# Patient Record
Sex: Female | Born: 1950 | Race: White | Hispanic: No | Marital: Single | Smoking: Never smoker
Health system: Southern US, Community
[De-identification: ages and names within clinical notes are randomized; demographics above are authoritative.]

## PROBLEM LIST (undated history)

## (undated) DIAGNOSIS — N189 Chronic kidney disease, unspecified: Secondary | ICD-10-CM

## (undated) DIAGNOSIS — E7800 Pure hypercholesterolemia, unspecified: Secondary | ICD-10-CM

## (undated) DIAGNOSIS — I10 Essential (primary) hypertension: Secondary | ICD-10-CM

## (undated) DIAGNOSIS — R87619 Unspecified abnormal cytological findings in specimens from cervix uteri: Secondary | ICD-10-CM

## (undated) DIAGNOSIS — H269 Unspecified cataract: Secondary | ICD-10-CM

## (undated) DIAGNOSIS — T7840XA Allergy, unspecified, initial encounter: Secondary | ICD-10-CM

## (undated) HISTORY — PX: GYNECOLOGIC CRYOSURGERY: SHX857

## (undated) HISTORY — PX: DILATION AND CURETTAGE OF UTERUS: SHX78

## (undated) HISTORY — PX: HERNIA REPAIR: SHX51

## (undated) HISTORY — DX: Chronic kidney disease, unspecified: N18.9

## (undated) HISTORY — DX: Allergy, unspecified, initial encounter: T78.40XA

## (undated) HISTORY — DX: Unspecified abnormal cytological findings in specimens from cervix uteri: R87.619

## (undated) HISTORY — DX: Unspecified cataract: H26.9

## (undated) HISTORY — DX: Pure hypercholesterolemia, unspecified: E78.00

---

## 1979-02-28 HISTORY — PX: GYNECOLOGIC CRYOSURGERY: SHX857

## 2001-02-27 HISTORY — PX: DILATION AND CURETTAGE OF UTERUS: SHX78

## 2011-06-21 ENCOUNTER — Other Ambulatory Visit (HOSPITAL_BASED_OUTPATIENT_CLINIC_OR_DEPARTMENT_OTHER): Payer: Self-pay | Admitting: Family Medicine

## 2011-06-21 ENCOUNTER — Ambulatory Visit (HOSPITAL_BASED_OUTPATIENT_CLINIC_OR_DEPARTMENT_OTHER)
Admission: RE | Admit: 2011-06-21 | Discharge: 2011-06-21 | Disposition: A | Discharge status: Home / Self Care | Payer: BC Managed Care – PPO | Source: Ambulatory Visit | Attending: Family Medicine | Admitting: Family Medicine

## 2011-06-21 DIAGNOSIS — R1013 Epigastric pain: Secondary | ICD-10-CM | POA: Insufficient documentation

## 2011-06-21 DIAGNOSIS — K7689 Other specified diseases of liver: Secondary | ICD-10-CM | POA: Insufficient documentation

## 2015-03-31 DIAGNOSIS — H2513 Age-related nuclear cataract, bilateral: Secondary | ICD-10-CM | POA: Insufficient documentation

## 2015-03-31 DIAGNOSIS — E782 Mixed hyperlipidemia: Secondary | ICD-10-CM | POA: Insufficient documentation

## 2015-03-31 DIAGNOSIS — I10 Essential (primary) hypertension: Secondary | ICD-10-CM | POA: Diagnosis present

## 2015-03-31 DIAGNOSIS — R748 Abnormal levels of other serum enzymes: Secondary | ICD-10-CM | POA: Diagnosis present

## 2015-03-31 DIAGNOSIS — K760 Fatty (change of) liver, not elsewhere classified: Secondary | ICD-10-CM | POA: Diagnosis present

## 2017-03-20 DIAGNOSIS — M810 Age-related osteoporosis without current pathological fracture: Secondary | ICD-10-CM | POA: Insufficient documentation

## 2018-04-29 DIAGNOSIS — R311 Benign essential microscopic hematuria: Secondary | ICD-10-CM | POA: Insufficient documentation

## 2020-05-03 ENCOUNTER — Emergency Department (HOSPITAL_BASED_OUTPATIENT_CLINIC_OR_DEPARTMENT_OTHER): Payer: Medicare PPO

## 2020-05-03 ENCOUNTER — Encounter (HOSPITAL_BASED_OUTPATIENT_CLINIC_OR_DEPARTMENT_OTHER): Payer: Self-pay | Admitting: *Deleted

## 2020-05-03 ENCOUNTER — Inpatient Hospital Stay (HOSPITAL_COMMUNITY): Payer: Medicare PPO

## 2020-05-03 ENCOUNTER — Inpatient Hospital Stay (HOSPITAL_BASED_OUTPATIENT_CLINIC_OR_DEPARTMENT_OTHER)
Admission: EM | Admit: 2020-05-03 | Discharge: 2020-05-09 | DRG: 328 | Disposition: A | Discharge status: Home / Self Care | Payer: Medicare PPO | Attending: Surgery | Admitting: Surgery

## 2020-05-03 ENCOUNTER — Other Ambulatory Visit: Payer: Self-pay

## 2020-05-03 DIAGNOSIS — I701 Atherosclerosis of renal artery: Secondary | ICD-10-CM | POA: Diagnosis present

## 2020-05-03 DIAGNOSIS — Z888 Allergy status to other drugs, medicaments and biological substances status: Secondary | ICD-10-CM | POA: Diagnosis not present

## 2020-05-03 DIAGNOSIS — K760 Fatty (change of) liver, not elsewhere classified: Secondary | ICD-10-CM | POA: Diagnosis present

## 2020-05-03 DIAGNOSIS — I10 Essential (primary) hypertension: Secondary | ICD-10-CM | POA: Diagnosis present

## 2020-05-03 DIAGNOSIS — K3189 Other diseases of stomach and duodenum: Secondary | ICD-10-CM | POA: Diagnosis present

## 2020-05-03 DIAGNOSIS — R1012 Left upper quadrant pain: Secondary | ICD-10-CM | POA: Diagnosis not present

## 2020-05-03 DIAGNOSIS — R112 Nausea with vomiting, unspecified: Secondary | ICD-10-CM | POA: Diagnosis not present

## 2020-05-03 DIAGNOSIS — R1110 Vomiting, unspecified: Secondary | ICD-10-CM

## 2020-05-03 DIAGNOSIS — Z9889 Other specified postprocedural states: Secondary | ICD-10-CM

## 2020-05-03 DIAGNOSIS — Z882 Allergy status to sulfonamides status: Secondary | ICD-10-CM

## 2020-05-03 DIAGNOSIS — K224 Dyskinesia of esophagus: Secondary | ICD-10-CM | POA: Diagnosis present

## 2020-05-03 DIAGNOSIS — K449 Diaphragmatic hernia without obstruction or gangrene: Secondary | ICD-10-CM | POA: Diagnosis present

## 2020-05-03 DIAGNOSIS — Z20822 Contact with and (suspected) exposure to covid-19: Secondary | ICD-10-CM | POA: Diagnosis present

## 2020-05-03 DIAGNOSIS — Z0189 Encounter for other specified special examinations: Secondary | ICD-10-CM

## 2020-05-03 DIAGNOSIS — Z79899 Other long term (current) drug therapy: Secondary | ICD-10-CM | POA: Diagnosis not present

## 2020-05-03 DIAGNOSIS — K2100 Gastro-esophageal reflux disease with esophagitis, without bleeding: Secondary | ICD-10-CM | POA: Diagnosis present

## 2020-05-03 DIAGNOSIS — R748 Abnormal levels of other serum enzymes: Secondary | ICD-10-CM | POA: Diagnosis present

## 2020-05-03 HISTORY — DX: Essential (primary) hypertension: I10

## 2020-05-03 LAB — CBC
HCT: 44.5 % (ref 36.0–46.0)
Hemoglobin: 15.4 g/dL — ABNORMAL HIGH (ref 12.0–15.0)
MCH: 29.8 pg (ref 26.0–34.0)
MCHC: 34.6 g/dL (ref 30.0–36.0)
MCV: 86.1 fL (ref 80.0–100.0)
Platelets: 257 10*3/uL (ref 150–400)
RBC: 5.17 MIL/uL — ABNORMAL HIGH (ref 3.87–5.11)
RDW: 12.4 % (ref 11.5–15.5)
WBC: 6.1 10*3/uL (ref 4.0–10.5)
nRBC: 0 % (ref 0.0–0.2)

## 2020-05-03 LAB — TROPONIN I (HIGH SENSITIVITY)
Troponin I (High Sensitivity): 5 ng/L (ref <18)
Troponin I (High Sensitivity): 5 ng/L (ref <18)

## 2020-05-03 LAB — HEPATIC FUNCTION PANEL
ALT: 56 U/L — ABNORMAL HIGH (ref 0–44)
AST: 55 U/L — ABNORMAL HIGH (ref 15–41)
Albumin: 4.7 g/dL (ref 3.5–5.0)
Alkaline Phosphatase: 70 U/L (ref 38–126)
Bilirubin, Direct: 0.1 mg/dL (ref 0.0–0.2)
Indirect Bilirubin: 1.1 mg/dL — ABNORMAL HIGH (ref 0.3–0.9)
Total Bilirubin: 1.2 mg/dL (ref 0.3–1.2)
Total Protein: 8.5 g/dL — ABNORMAL HIGH (ref 6.5–8.1)

## 2020-05-03 LAB — BASIC METABOLIC PANEL
Anion gap: 14 (ref 5–15)
BUN: 20 mg/dL (ref 8–23)
CO2: 25 mmol/L (ref 22–32)
Calcium: 10.3 mg/dL (ref 8.9–10.3)
Chloride: 98 mmol/L (ref 98–111)
Creatinine, Ser: 1.05 mg/dL — ABNORMAL HIGH (ref 0.44–1.00)
GFR, Estimated: 58 mL/min — ABNORMAL LOW (ref >60)
Glucose, Bld: 112 mg/dL — ABNORMAL HIGH (ref 70–99)
Potassium: 2.9 mmol/L — ABNORMAL LOW (ref 3.5–5.1)
Sodium: 137 mmol/L (ref 135–145)

## 2020-05-03 LAB — RESP PANEL BY RT-PCR (FLU A&B, COVID) ARPGX2
Influenza A by PCR: NEGATIVE
Influenza B by PCR: NEGATIVE
SARS Coronavirus 2 by RT PCR: NEGATIVE

## 2020-05-03 LAB — LIPASE, BLOOD: Lipase: 32 U/L (ref 11–51)

## 2020-05-03 MED ORDER — SODIUM CHLORIDE 0.9 % IV BOLUS
500.0000 mL | Freq: Once | INTRAVENOUS | Status: AC
  Administered 2020-05-03: 500 mL via INTRAVENOUS

## 2020-05-03 MED ORDER — ONDANSETRON HCL 4 MG/2ML IJ SOLN: 4.0000 mg | Freq: Four times a day (QID) | INTRAMUSCULAR | Status: DC | PRN

## 2020-05-03 MED ORDER — FENTANYL CITRATE (PF) 100 MCG/2ML IJ SOLN
50.0000 ug | Freq: Once | INTRAMUSCULAR | Status: AC
  Administered 2020-05-03: 50 ug via INTRAVENOUS
  Filled 2020-05-03: qty 2

## 2020-05-03 MED ORDER — IOHEXOL 350 MG/ML SOLN
100.0000 mL | Freq: Once | INTRAVENOUS | Status: AC | PRN
  Administered 2020-05-03: 100 mL via INTRAVENOUS

## 2020-05-03 MED ORDER — ONDANSETRON 4 MG PO TBDP: 4.0000 mg | ORAL_TABLET | Freq: Four times a day (QID) | ORAL | Status: DC | PRN

## 2020-05-03 MED ORDER — HYDROMORPHONE HCL 1 MG/ML IJ SOLN
0.5000 mg | INTRAMUSCULAR | Status: DC | PRN
  Administered 2020-05-03 – 2020-05-06 (×5): 0.5 mg via INTRAVENOUS
  Filled 2020-05-03 (×5): qty 0.5

## 2020-05-03 MED ORDER — ENOXAPARIN SODIUM 40 MG/0.4ML ~~LOC~~ SOLN
40.0000 mg | SUBCUTANEOUS | Status: DC
  Administered 2020-05-03 – 2020-05-05 (×3): 40 mg via SUBCUTANEOUS
  Filled 2020-05-03 (×3): qty 0.4

## 2020-05-03 MED ORDER — METOPROLOL TARTRATE 5 MG/5ML IV SOLN
5.0000 mg | Freq: Four times a day (QID) | INTRAVENOUS | Status: DC | PRN
  Administered 2020-05-06: 5 mg via INTRAVENOUS
  Filled 2020-05-03 (×3): qty 5

## 2020-05-03 MED ORDER — ONDANSETRON HCL 4 MG/2ML IJ SOLN
4.0000 mg | Freq: Once | INTRAMUSCULAR | Status: AC
  Administered 2020-05-03: 4 mg via INTRAVENOUS
  Filled 2020-05-03: qty 2

## 2020-05-03 MED ORDER — KCL IN DEXTROSE-NACL 20-5-0.45 MEQ/L-%-% IV SOLN
INTRAVENOUS | Status: DC
  Filled 2020-05-03 (×8): qty 1000

## 2020-05-03 NOTE — ED Triage Notes (Signed)
Left upper quad abdominal pain into her epigastric region x 3 hours with sudden onset after lunch. No relief with pepto bismol.

## 2020-05-03 NOTE — ED Provider Notes (Signed)
[facility] EMERGENCY DEPARTMENT Provider Note   CSN: [id number] Arrival date & time: 05/03/20  1556     History Chief Complaint  Patient presents with  . Abdominal Pain    Monica Lara is a 70 y.o. female.  The history is provided by the patient and medical records.  Abdominal Pain  Monica Lara is a 70 y.o. female who presents to the Emergency Department complaining of abdominal pain. She presents the emergency department complaining of severe and sudden onset left upper quadrant abdominal pain that started about three hours ago. Initially pain was located in her central/lower chest and now radiates to her left upper quadrant. Pain is constant and severe. She has associated nausea. No vomiting, fevers. She initially had difficulty breathing, this is now improving. No prior similar symptoms. She has a history of hypertension, no additional medical problems. No prior abdominal surgeries.    Past Medical History:  Diagnosis Date  . Hypertension     Patient Active Problem List   Diagnosis Date Noted  . Acute gastric volvulus 05/03/2020    History reviewed. No pertinent surgical history.   OB History   No obstetric history on file.     No family history on file.  Social History   Tobacco Use  . Smoking status: Never Smoker  . Smokeless tobacco: Never Used  Substance Use Topics  . Alcohol use: Never  . Drug use: Never    Home Medications Prior to Admission medications   Medication Sig Start Date End Date Taking? Authorizing Provider  telmisartan-hydrochlorothiazide (MICARDIS HCT) 40-12.5 MG tablet Take 1 tablet by mouth daily. 08/21/19  Yes [provider]    Allergies    3-methyl-2-benzothiazolinone hydrazone, Ezetimibe, Sulfa antibiotics, and Statins  Review of Systems   Review of Systems  Gastrointestinal: Positive for abdominal pain.  All other systems reviewed and are negative.   Physical Exam Updated Vital Signs BP (!) 162/90 (BP  Location: Right Arm)   Pulse 98   Temp 98.7 F (37.1 C) (Oral)   Resp 20   Ht 5' (1.524 m)   Wt 63.5 kg   SpO2 95%   BMI 27.34 kg/m   Physical Exam Vitals and nursing note reviewed.  Constitutional:      General: She is in acute distress.     Appearance: She is well-developed and well-nourished.     Comments: Appears uncomfortable, pacing in the room.  HENT:     Head: Normocephalic and atraumatic.  Cardiovascular:     Rate and Rhythm: Normal rate and regular rhythm.     Heart sounds: No murmur heard.   Pulmonary:     Effort: Pulmonary effort is normal. No respiratory distress.     Breath sounds: Normal breath sounds.  Abdominal:     Palpations: Abdomen is soft.     Tenderness: There is no abdominal tenderness. There is no guarding or rebound.  Musculoskeletal:        General: No tenderness or edema.     Comments: 2+ DP pulses bilaterally  Skin:    General: Skin is warm and dry.  Neurological:     Mental Status: She is alert and oriented to person, place, and time.  Psychiatric:        Mood and Affect: Mood and affect normal.        Behavior: Behavior normal.     ED Results / Procedures / Treatments   Labs (all labs ordered are listed, but only abnormal results are displayed)  Labs Reviewed  BASIC METABOLIC PANEL - Abnormal; Notable for the following components:      Result Value   Potassium 2.9 (*)    Glucose, Bld 112 (*)    Creatinine, Ser 1.05 (*)    GFR, Estimated 58 (*)    All other components within normal limits  CBC - Abnormal; Notable for the following components:   RBC 5.17 (*)    Hemoglobin 15.4 (*)    All other components within normal limits  HEPATIC FUNCTION PANEL - Abnormal; Notable for the following components:   Total Protein 8.5 (*)    AST 55 (*)    ALT 56 (*)    Indirect Bilirubin 1.1 (*)    All other components within normal limits  RESP PANEL BY RT-PCR (FLU A&B, COVID) ARPGX2  LIPASE, BLOOD  HIV ANTIBODY (ROUTINE TESTING W REFLEX)   CBC  BASIC METABOLIC PANEL  TROPONIN I (HIGH SENSITIVITY)  TROPONIN I (HIGH SENSITIVITY)    EKG EKG Interpretation  Date/Time:  Monday May 03 2020 16:08:57 EST Ventricular Rate:  94 PR Interval:  182 QRS Duration: 70 QT Interval:  358 QTC Calculation: 447 R Axis:   44 Text Interpretation: Normal sinus rhythm Cannot rule out Anterior infarct , age undetermined Abnormal ECG No previous tracing Confirmed by Tilden Fossa [phone]) on 05/03/2020 4:11:52 PM   Radiology DG Chest 2 View  Result Date: 05/03/2020 CLINICAL DATA:  Epigastric pain. EXAM: CHEST - 2 VIEW COMPARISON:  None. FINDINGS: The cardiac silhouette is normal in size. There is a moderate-sized hiatal hernia. There is mild elevation of the right hemidiaphragm. No airspace consolidation, edema, pleural effusion, or pneumothorax is identified. No acute osseous abnormality is seen. IMPRESSION: Hiatal hernia.  No evidence of acute cardiopulmonary disease. Electronically Signed   By: Sebastian Ache M.D.   On: 05/03/2020 16:54    Procedures Procedures  CRITICAL CARE Performed by: Tilden Fossa   Total critical care time: 35 minutes  Critical care time was exclusive of separately billable procedures and treating other patients.  Critical care was necessary to treat or prevent imminent or life-threatening deterioration.  Critical care was time spent personally by me on the following activities: development of treatment plan with patient and/or surrogate as well as nursing, discussions with consultants, evaluation of patient's response to treatment, examination of patient, obtaining history from patient or surrogate, ordering and performing treatments and interventions, ordering and review of laboratory studies, ordering and review of radiographic studies, pulse oximetry and re-evaluation of patient's condition.  Medications Ordered in ED Medications  enoxaparin (LOVENOX) injection 40 mg (40 mg Subcutaneous Given 05/03/20 2221)   dextrose 5 % and 0.45 % NaCl with KCl 20 mEq/L infusion ( Intravenous New Bag/Given 05/03/20 2221)  HYDROmorphone (DILAUDID) injection 0.5 mg (0.5 mg Intravenous Given 05/03/20 2221)  ondansetron (ZOFRAN-ODT) disintegrating tablet 4 mg (has no administration in time range)    Or  ondansetron (ZOFRAN) injection 4 mg (has no administration in time range)  metoprolol tartrate (LOPRESSOR) injection 5 mg (has no administration in time range)  fentaNYL (SUBLIMAZE) injection 50 mcg (50 mcg Intravenous Given 05/03/20 1707)  ondansetron (ZOFRAN) injection 4 mg (4 mg Intravenous Given 05/03/20 1706)  sodium chloride 0.9 % bolus 500 mL (0 mLs Intravenous Stopped 05/03/20 1942)  iohexol (OMNIPAQUE) 350 MG/ML injection 100 mL (100 mLs Intravenous Contrast Given 05/03/20 1748)  ondansetron (ZOFRAN) injection 4 mg (4 mg Intravenous Given 05/03/20 1830)    ED Course  I have reviewed the triage vital  signs and the nursing notes.  Pertinent labs & imaging results that were available during my care of the patient were reviewed by me and considered in my medical decision making (see chart for details).    MDM Rules/Calculators/A&P                          Patient here for evaluation of acute onset left upper quadrant abdominal pain. Patient uncomfortable appearing on ED presentation. Imaging is concerning for gastric volvulus. Discussed with Dr. Maisie Fus with general surgery. Will place NG tube and transfer to [facility] for further evaluation. Patient is in agreement with treatment plan. Final Clinical Impression(s) / ED Diagnoses Final diagnoses:  Acute gastric volvulus    Rx / DC Orders ED Discharge Orders    None       Tilden Fossa, MD 05/03/20 2326

## 2020-05-03 NOTE — ED Notes (Signed)
After placing the NG tube X ray was called for portable x ray to check placement.  Dr Madilyn Hook was called to look at x ray to look at the screen before hooked to low wall intermittent suction.

## 2020-05-03 NOTE — H&P (Signed)
CC: abd pain  Requesting provider: Dr Madilyn Hook  HPI: Monica Lara is an 70 y.o. female [facility] complaining of severe sudden onset LUQ abdominal pain that started about three hours ago. Initially pain was located in her central/lower chest and now radiates to her left upper quadrant. Pain is constant and severe. She has associated nausea. No vomiting, fevers. She initially had difficulty breathing, this is now improved. No prior similar symptoms. She has a history of hypertension, no additional medical problems. No prior abdominal surgeries.  Past Medical History:  Diagnosis Date  . Hypertension     History reviewed. No pertinent surgical history.  No family history on file.  Social:  reports that she has never smoked. She has never used smokeless tobacco. She reports that she does not drink alcohol and does not use drugs.  Allergies: No Known Allergies  Medications: I have reviewed the patient's current medications.  Results for orders placed or performed during the hospital encounter of 05/03/20 (from the past 48 hour(s))  Basic metabolic panel     Status: Abnormal   Collection Time: 05/03/20  4:20 PM  Result Value Ref Range   Sodium 137 135 - 145 mmol/L   Potassium 2.9 (L) 3.5 - 5.1 mmol/L   Chloride 98 98 - 111 mmol/L   CO2 25 22 - 32 mmol/L   Glucose, Bld 112 (H) 70 - 99 mg/dL    Comment: Glucose reference range applies only to samples taken after fasting for at least 8 hours.   BUN 20 8 - 23 mg/dL   Creatinine, Ser 3.97 (H) 0.44 - 1.00 mg/dL   Calcium 67.3 8.9 - 41.9 mg/dL   GFR, Estimated 58 (L) >60 mL/min    Comment: (NOTE) Calculated using the CKD-EPI Creatinine Equation (2021)    Anion gap 14 5 - 15    Comment: Performed at [facility], [address] [place], [place] [address]  CBC     Status: Abnormal   Collection Time: 05/03/20  4:20 PM  Result Value Ref Range   WBC 6.1 4.0 - 10.5 K/uL   RBC 5.17 (H) 3.87 -  5.11 MIL/uL   Hemoglobin 15.4 (H) 12.0 - 15.0 g/dL   HCT 40.9 73.5 - 32.9 %   MCV 86.1 80.0 - 100.0 fL   MCH 29.8 26.0 - 34.0 pg   MCHC 34.6 30.0 - 36.0 g/dL   RDW 92.4 26.8 - 34.1 %   Platelets 257 150 - 400 K/uL   nRBC 0.0 0.0 - 0.2 %    Comment: Performed at [facility], [address] [place], [place] [address]  Troponin I (High Sensitivity)     Status: None   Collection Time: 05/03/20  4:20 PM  Result Value Ref Range   Troponin I (High Sensitivity) 5 <18 ng/L    Comment: (NOTE) Elevated high sensitivity troponin I (hsTnI) values and significant  changes across serial measurements may suggest ACS but many other  chronic and acute conditions are known to elevate hsTnI results.  Refer to the "Links" section for chest pain algorithms and additional  guidance. Performed at [facility], [address] [place], [place] [address]   Hepatic function panel     Status: Abnormal   Collection Time: 05/03/20  5:04 PM  Result Value Ref Range   Total Protein 8.5 (H) 6.5 - 8.1 g/dL   Albumin 4.7 3.5 - 5.0 g/dL  AST 55 (H) 15 - 41 U/L   ALT 56 (H) 0 - 44 U/L   Alkaline Phosphatase 70 38 - 126 U/L   Total Bilirubin 1.2 0.3 - 1.2 mg/dL   Bilirubin, Direct 0.1 0.0 - 0.2 mg/dL   Indirect Bilirubin 1.1 (H) 0.3 - 0.9 mg/dL    Comment: Performed at [facility], [address] [place], [place] [address]  Lipase, blood     Status: None   Collection Time: 05/03/20  5:04 PM  Result Value Ref Range   Lipase 32 11 - 51 U/L    Comment: Performed at [facility], [address] [place], [place] [address]  Troponin I (High Sensitivity)     Status: None   Collection Time: 05/03/20  6:28 PM  Result Value Ref Range   Troponin I (High Sensitivity) 5 <18 ng/L    Comment: (NOTE) Elevated high sensitivity troponin I (hsTnI) values and significant  changes across serial measurements may suggest ACS but many other  chronic and acute conditions are known to  elevate hsTnI results.  Refer to the "Links" section for chest pain algorithms and additional  guidance. Performed at [facility], [address], [place], [place] [address]     DG Chest 2 View  Result Date: 05/03/2020 CLINICAL DATA:  Epigastric pain. EXAM: CHEST - 2 VIEW COMPARISON:  None. FINDINGS: The cardiac silhouette is normal in size. There is a moderate-sized hiatal hernia. There is mild elevation of the right hemidiaphragm. No airspace consolidation, edema, pleural effusion, or pneumothorax is identified. No acute osseous abnormality is seen. IMPRESSION: Hiatal hernia.  No evidence of acute cardiopulmonary disease. Electronically Signed   By: Sebastian Ache M.D.   On: 05/03/2020 16:54    ROS - all of the below systems have been reviewed with the patient and positives are indicated with bold text General: chills, fever or night sweats Eyes: blurry vision or double vision ENT: epistaxis or sore throat Allergy/Immunology: itchy/watery eyes or nasal congestion Hematologic/Lymphatic: bleeding problems, blood clots or swollen lymph nodes Endocrine: temperature intolerance or unexpected weight changes Breast: new or changing breast lumps or nipple discharge Resp: cough, shortness of breath, or wheezing CV: chest pain or dyspnea on exertion GI: as per HPI GU: dysuria, trouble voiding, or hematuria MSK: joint pain or joint stiffness Neuro: TIA or stroke symptoms Derm: pruritus and skin lesion changes Psych: anxiety and depression  PE Blood pressure (!) 164/91, pulse 86, temperature 97.7 F (36.5 C), temperature source Oral, resp. rate 18, height 5' (1.524 m), weight 63.5 kg, SpO2 98 %. Constitutional: NAD; conversant; no deformities Eyes: Moist conjunctiva; no lid lag; anicteric; PERRL Neck: Trachea midline; no thyromegaly Lungs: Normal respiratory effort; no tactile fremitus CV: RRR; no palpable thrills; no pitting edema GI: Abd soft; no palpable  hepatosplenomegaly MSK: Normal range of motion of extremities; no clubbing/cyanosis Psychiatric: Appropriate affect; alert and oriented x3 Lymphatic: No palpable cervical or axillary lymphadenopathy  Results for orders placed or performed during the hospital encounter of 05/03/20 (from the past 48 hour(s))  Basic metabolic panel     Status: Abnormal   Collection Time: 05/03/20  4:20 PM  Result Value Ref Range   Sodium 137 135 - 145 mmol/L   Potassium 2.9 (L) 3.5 - 5.1 mmol/L   Chloride 98 98 - 111 mmol/L   CO2 25 22 - 32 mmol/L   Glucose, Bld 112 (H) 70 - 99 mg/dL    Comment:  Glucose reference range applies only to samples taken after fasting for at least 8 hours.   BUN 20 8 - 23 mg/dL   Creatinine, Ser 5.281.05 (H) 0.44 - 1.00 mg/dL   Calcium 41.310.3 8.9 - 24.410.3 mg/dL   GFR, Estimated 58 (L) >60 mL/min    Comment: (NOTE) Calculated using the CKD-EPI Creatinine Equation (2021)    Anion gap 14 5 - 15    Comment: Performed at [facility], [address] [place], [place] [address]  CBC     Status: Abnormal   Collection Time: 05/03/20  4:20 PM  Result Value Ref Range   WBC 6.1 4.0 - 10.5 K/uL   RBC 5.17 (H) 3.87 - 5.11 MIL/uL   Hemoglobin 15.4 (H) 12.0 - 15.0 g/dL   HCT 25.344.5 66.436.0 - 40.346.0 %   MCV 86.1 80.0 - 100.0 fL   MCH 29.8 26.0 - 34.0 pg   MCHC 34.6 30.0 - 36.0 g/dL   RDW 47.412.4 25.911.5 - 56.315.5 %   Platelets 257 150 - 400 K/uL   nRBC 0.0 0.0 - 0.2 %    Comment: Performed at [facility], [address] [place], [place] [address]  Troponin I (High Sensitivity)     Status: None   Collection Time: 05/03/20  4:20 PM  Result Value Ref Range   Troponin I (High Sensitivity) 5 <18 ng/L    Comment: (NOTE) Elevated high sensitivity troponin I (hsTnI) values and significant  changes across serial measurements may suggest ACS but many other  chronic and acute conditions are known to elevate hsTnI results.  Refer to the "Links" section for chest pain algorithms and  additional  guidance. Performed at [facility], [address] [place], [place] [address]   Hepatic function panel     Status: Abnormal   Collection Time: 05/03/20  5:04 PM  Result Value Ref Range   Total Protein 8.5 (H) 6.5 - 8.1 g/dL   Albumin 4.7 3.5 - 5.0 g/dL   AST 55 (H) 15 - 41 U/L   ALT 56 (H) 0 - 44 U/L   Alkaline Phosphatase 70 38 - 126 U/L   Total Bilirubin 1.2 0.3 - 1.2 mg/dL   Bilirubin, Direct 0.1 0.0 - 0.2 mg/dL   Indirect Bilirubin 1.1 (H) 0.3 - 0.9 mg/dL    Comment: Performed at [facility], [address] [place], [place] [address]  Lipase, blood     Status: None   Collection Time: 05/03/20  5:04 PM  Result Value Ref Range   Lipase 32 11 - 51 U/L    Comment: Performed at [facility], [address] [place], [place] [address]  Troponin I (High Sensitivity)     Status: None   Collection Time: 05/03/20  6:28 PM  Result Value Ref Range   Troponin I (High Sensitivity) 5 <18 ng/L    Comment: (NOTE) Elevated high sensitivity troponin I (hsTnI) values and significant  changes across serial measurements may suggest ACS but many other  chronic and acute conditions are known to elevate hsTnI results.  Refer to the "Links" section for chest pain algorithms and additional  guidance. Performed at [facility], [address] [place], [place] [address]     DG Chest 2 View  Result Date: 05/03/2020 CLINICAL DATA:  Epigastric pain. EXAM: CHEST - 2 VIEW COMPARISON:  None. FINDINGS: The cardiac silhouette is normal in size.  There is a moderate-sized hiatal hernia. There is mild elevation of the right hemidiaphragm. No airspace consolidation, edema, pleural effusion, or pneumothorax is identified. No acute osseous abnormality is seen. IMPRESSION: Hiatal hernia.  No evidence of acute cardiopulmonary disease. Electronically Signed   By: Sebastian Ache M.D.   On: 05/03/2020 16:54     A/P: Monica Lara is an 70 y.o. female with  gastric volvulus transferred emergently to [facility].  NG inserted but unable to pass the obstruction and NPO.  Will consult GI.  Pt will most likely need surgical correction due to obstructive symptoms.     Vanita Panda, MD  Colorectal and General Surgery [facility] Surgery

## 2020-05-03 NOTE — ED Notes (Signed)
Pt. Reports she had Mac and Cheese for lunch today

## 2020-05-04 ENCOUNTER — Encounter (HOSPITAL_COMMUNITY): Admission: EM | Disposition: A | Discharge status: Home / Self Care | Payer: Self-pay | Source: Home / Self Care

## 2020-05-04 ENCOUNTER — Inpatient Hospital Stay (HOSPITAL_COMMUNITY): Payer: Medicare PPO

## 2020-05-04 ENCOUNTER — Encounter (HOSPITAL_COMMUNITY): Payer: Self-pay

## 2020-05-04 ENCOUNTER — Inpatient Hospital Stay (HOSPITAL_COMMUNITY): Payer: Medicare PPO | Admitting: Anesthesiology

## 2020-05-04 DIAGNOSIS — K2100 Gastro-esophageal reflux disease with esophagitis, without bleeding: Secondary | ICD-10-CM

## 2020-05-04 DIAGNOSIS — R112 Nausea with vomiting, unspecified: Secondary | ICD-10-CM | POA: Diagnosis not present

## 2020-05-04 DIAGNOSIS — K449 Diaphragmatic hernia without obstruction or gangrene: Secondary | ICD-10-CM

## 2020-05-04 DIAGNOSIS — K3189 Other diseases of stomach and duodenum: Principal | ICD-10-CM

## 2020-05-04 DIAGNOSIS — R1110 Vomiting, unspecified: Secondary | ICD-10-CM

## 2020-05-04 DIAGNOSIS — R1012 Left upper quadrant pain: Secondary | ICD-10-CM | POA: Diagnosis not present

## 2020-05-04 HISTORY — PX: ESOPHAGOGASTRODUODENOSCOPY (EGD) WITH PROPOFOL: SHX5813

## 2020-05-04 LAB — BASIC METABOLIC PANEL
Anion gap: 10 (ref 5–15)
BUN: 15 mg/dL (ref 8–23)
CO2: 22 mmol/L (ref 22–32)
Calcium: 9.5 mg/dL (ref 8.9–10.3)
Chloride: 105 mmol/L (ref 98–111)
Creatinine, Ser: 1.06 mg/dL — ABNORMAL HIGH (ref 0.44–1.00)
GFR, Estimated: 57 mL/min — ABNORMAL LOW (ref >60)
Glucose, Bld: 137 mg/dL — ABNORMAL HIGH (ref 70–99)
Potassium: 3.3 mmol/L — ABNORMAL LOW (ref 3.5–5.1)
Sodium: 137 mmol/L (ref 135–145)

## 2020-05-04 LAB — CBC
HCT: 36.4 % (ref 36.0–46.0)
Hemoglobin: 12.2 g/dL (ref 12.0–15.0)
MCH: 29.8 pg (ref 26.0–34.0)
MCHC: 33.5 g/dL (ref 30.0–36.0)
MCV: 89 fL (ref 80.0–100.0)
Platelets: 221 10*3/uL (ref 150–400)
RBC: 4.09 MIL/uL (ref 3.87–5.11)
RDW: 12.7 % (ref 11.5–15.5)
WBC: 7.2 10*3/uL (ref 4.0–10.5)
nRBC: 0 % (ref 0.0–0.2)

## 2020-05-04 LAB — HIV ANTIBODY (ROUTINE TESTING W REFLEX): HIV Screen 4th Generation wRfx: NONREACTIVE

## 2020-05-04 SURGERY — ESOPHAGOGASTRODUODENOSCOPY (EGD) WITH PROPOFOL
Anesthesia: General

## 2020-05-04 SURGERY — ESOPHAGOGASTRODUODENOSCOPY (EGD) WITH PROPOFOL
Anesthesia: Monitor Anesthesia Care

## 2020-05-04 MED ORDER — FENTANYL CITRATE (PF) 100 MCG/2ML IJ SOLN
INTRAMUSCULAR | Status: DC | PRN
  Administered 2020-05-04 (×2): 50 ug via INTRAVENOUS

## 2020-05-04 MED ORDER — PROPOFOL 10 MG/ML IV BOLUS
INTRAVENOUS | Status: DC | PRN
  Administered 2020-05-04: 160 mg via INTRAVENOUS

## 2020-05-04 MED ORDER — SUCCINYLCHOLINE CHLORIDE 200 MG/10ML IV SOSY
PREFILLED_SYRINGE | INTRAVENOUS | Status: DC | PRN
  Administered 2020-05-04: 120 mg via INTRAVENOUS

## 2020-05-04 MED ORDER — ONDANSETRON HCL 4 MG/2ML IJ SOLN
INTRAMUSCULAR | Status: DC | PRN
  Administered 2020-05-04: 4 mg via INTRAVENOUS

## 2020-05-04 MED ORDER — PROPOFOL 10 MG/ML IV BOLUS
INTRAVENOUS | Status: AC
  Filled 2020-05-04: qty 20

## 2020-05-04 MED ORDER — FENTANYL CITRATE (PF) 100 MCG/2ML IJ SOLN
INTRAMUSCULAR | Status: AC
  Filled 2020-05-04: qty 2

## 2020-05-04 MED ORDER — PHENOL 1.4 % MT LIQD
1.0000 | OROMUCOSAL | Status: DC | PRN
  Administered 2020-05-04: 1 via OROMUCOSAL
  Filled 2020-05-04 (×2): qty 177

## 2020-05-04 MED ORDER — LACTATED RINGERS IV SOLN: INTRAVENOUS | Status: DC | PRN

## 2020-05-04 MED ORDER — LIDOCAINE 2% (20 MG/ML) 5 ML SYRINGE
INTRAMUSCULAR | Status: DC | PRN
  Administered 2020-05-04: 80 mg via INTRAVENOUS

## 2020-05-04 MED ORDER — LIP MEDEX EX OINT
TOPICAL_OINTMENT | CUTANEOUS | Status: AC
  Filled 2020-05-04: qty 7

## 2020-05-04 MED ORDER — SODIUM CHLORIDE 0.9 % IV SOLN: INTRAVENOUS | Status: DC

## 2020-05-04 SURGICAL SUPPLY — 14 items

## 2020-05-04 NOTE — Progress Notes (Signed)
Unable to place NG tube in stomach after 3 attempts. Contacted on call MD who stated "We will deal with it in the morning." Patient is resting with eyes closed. No c/o nausea, vomiting, nor pain voiced.

## 2020-05-04 NOTE — Anesthesia Postprocedure Evaluation (Signed)
Anesthesia Post Note  Patient: [name]  Procedure(s) Performed: ESOPHAGOGASTRODUODENOSCOPY (EGD) WITH PROPOFOL (N/A )     Patient location during evaluation: Endoscopy Anesthesia Type: General Level of consciousness: patient cooperative, oriented and sedated Pain management: pain level controlled Vital Signs Assessment: post-procedure vital signs reviewed and stable Respiratory status: spontaneous breathing, nonlabored ventilation, respiratory function stable and patient connected to nasal cannula oxygen Cardiovascular status: blood pressure returned to baseline and stable Postop Assessment: no apparent nausea or vomiting Anesthetic complications: no   No complications documented.  Last Vitals:  Vitals:   05/04/20 1219 05/04/20 1243  BP: (!) 168/96 (!) 161/87  Pulse: 85 79  Resp: (!) 9 16  Temp:  36.6 C  SpO2: 96% 98%    Last Pain:  Vitals:   05/04/20 1243  TempSrc: Oral  PainSc:                  Tyshay Adee,E. Kreig Parson

## 2020-05-04 NOTE — Consult Note (Addendum)
Referring Provider:  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  --Patient needs EGD for evaluation and possible decompression. The risks and benefits of EGD were discussed and the patient agrees to proceed. Procedure will be done today at 11:30 am . The risks and benefits of EGD were discussed and the patient agrees to proceed.   # Hx of HTN. Renal artery stenosis on CTA.   # Mildly elevated liver enzymes. Probably fatty liver on imaging.     HPI:                                                                                                                             Chief Complaint: abdominal pain  / gastric volvulus  Monica Lara is a 70 y.o. female with a history of hyperilipidemia and HTN  Patient admitted yesterday with upper abdominal pain which started a few hours prior to going to ER. The pain was epigastric with radiation into LUQ. She had associated nausea without vomiting. Patient had no prior knowledge of a hiatal hernia. She had acid reflux when one of her HTN meds were changed but other than that no GI symptoms.  CTA chest, abd, and pelvis remarkable for a large hiatal hernia and concern for gastric volvulus with at least a partial obstruction and a 3.8 cm cystic mass in right hemipelvis. Patient has no other Gi or general medical complaints.    PREVIOUS ENDOSCOPIC EVALUATIONS / PERTINENT STUDIES   05/03/20 CTA chest / abd / pelvis IMPRESSION: 1. No evidence for aortic dissection. 2. 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 This process appears to be at least partially obstructing. As such, surgical consultation is recommended. 3. Atrophic left kidney. 4. There is  a 3.8 cm cystic mass in the right hemipelvis. Is difficult to determine if this is associated with the right ovary or separate from the right ovary. Follow-up with a nonemergent outpatient pelvic ultrasound is recommended. 5. Coronary artery calcifications  Colonoscopy in [place] ~ 5 years ago per patient. Results?  Past Medical History:  Diagnosis Date  . Hypertension     History reviewed. No pertinent surgical history.  Prior to Admission medications   Medication Sig Start Date End Date Taking? Authorizing Provider  telmisartan-hydrochlorothiazide (MICARDIS HCT) 40-12.5 MG tablet Take 1 tablet by mouth daily. 08/21/19  Yes [provider]    Current Facility-Administered Medications  Medication Dose Route Frequency Provider Last Rate Last Admin  . dextrose 5 % and 0.45 % NaCl with KCl 20 mEq/L infusion   Intravenous Continuous  Romie Levee, MD 75 mL/hr at 05/03/20 2221 New Bag at 05/03/20 2221  . enoxaparin (LOVENOX) injection 40 mg  40 mg Subcutaneous Q24H Romie Levee, MD   40 mg at 05/03/20 2221  . HYDROmorphone (DILAUDID) injection 0.5 mg  0.5 mg Intravenous Q3H PRN Romie Levee, MD   0.5 mg at 05/03/20 2221  . metoprolol tartrate (LOPRESSOR) injection 5 mg  5 mg Intravenous Q6H PRN Romie Levee, MD      . ondansetron (ZOFRAN-ODT) disintegrating tablet 4 mg  4 mg Oral Q6H PRN Romie Levee, MD       Or  . ondansetron [facility]) injection 4 mg  4 mg Intravenous Q6H PRN Romie Levee, MD        Allergies as of 05/03/2020 - Review Complete 05/03/2020  Allergen Reaction Noted  . 3-methyl-2-benzothiazolinone hydrazone  03/31/2015  . Ezetimibe Other (See Comments) 05/07/2018  . Sulfa antibiotics Rash 03/31/2015  . Statins Other (See Comments) 05/03/2020    No family history on file.  Social History   Socioeconomic History  . Marital status: Single    Spouse name: Not on file  . Number of children: Not on file  . Years of education: Not on file  .  Highest education level: Not on file  Occupational History  . Not on file  Tobacco Use  . Smoking status: Never Smoker  . Smokeless tobacco: Never Used  Substance and Sexual Activity  . Alcohol use: Never  . Drug use: Never  . Sexual activity: Not on file  Other Topics Concern  . Not on file  Social History Narrative  . Not on file   Social Determinants of Health   Financial Resource Strain: Not on file  Food Insecurity: Not on file  Transportation Needs: Not on file  Physical Activity: Not on file  Stress: Not on file  Social Connections: Not on file  Intimate Partner Violence: Not on file    Review of Systems: All systems reviewed and negative except where noted in HPI.    OBJECTIVE:    Physical Exam: Vital signs in last 24 hours: Temp:  [97.7 F (36.5 C)-98.7 F (37.1 C)] 98.3 F (36.8 C) (03/08 0515) Pulse Rate:  [85-110] 98 (03/08 0515) Resp:  [16-20] 18 (03/08 0515) BP: (125-181)/(75-93) 126/86 (03/08 0515) SpO2:  [95 %-98 %] 97 % (03/08 0515) Weight:  [63.5 kg] 63.5 kg (03/07 1608) Last BM Date: 05/03/20 General:   Alert  female in NAD. NGT capped off Psych:  Pleasant, cooperative. Normal mood and affect. Eyes:  Pupils equal, sclera clear, no icterus.   Conjunctiva pink. Ears:  Normal auditory acuity. Nose:  No deformity, discharge,  or lesions. Neck:  Supple; no masses Lungs:  Clear throughout to auscultation.   No wheezes, crackles, or rhonchi.  Heart:  Regular rate and rhythm; no lower extremity edema Abdomen:  Soft, non-distended, nontender, BS active, no palp mass   Rectal:  Deferred  Msk:  Symmetrical without gross deformities. . Neurologic:  Alert and  oriented x4;  grossly normal neurologically. Skin:  Intact without significant lesions or rashes.  Filed Weights   05/03/20 1608  Weight: 63.5 kg     Scheduled inpatient medications . enoxaparin (LOVENOX) injection  40 mg Subcutaneous Q24H      Intake/Output from previous day: 03/07  0701 - 03/08 0700 In: 424.1 [IV Piggyback:424.1] Out: 0  Intake/Output this shift: No intake/output data recorded.   Lab Results: Recent Labs    05/03/20 1620 05/04/20 0520  WBC 6.1 7.2  HGB 15.4* 12.2  HCT 44.5 36.4  PLT 257 221   BMET Recent Labs    05/03/20 1620 05/04/20 0520  NA 137 137  K 2.9* 3.3*  CL 98 105  CO2 25 22  GLUCOSE 112* 137*  BUN 20 15  CREATININE 1.05* 1.06*  CALCIUM 10.3 9.5   LFT Recent Labs    05/03/20 1704  PROT 8.5*  ALBUMIN 4.7  AST 55*  ALT 56*  ALKPHOS 70  BILITOT 1.2  BILIDIR 0.1  IBILI 1.1*   PT/INR No results for input(s): LABPROT, INR in the last 72 hours. Hepatitis Panel No results for input(s): HEPBSAG, HCVAB, HEPAIGM, HEPBIGM in the last 72 hours.   . CBC Latest Ref Rng & Units 05/04/2020 05/03/2020  WBC 4.0 - 10.5 K/uL 7.2 6.1  Hemoglobin 12.0 - 15.0 g/dL 23.5 15.4(H)  Hematocrit 36.0 - 46.0 % 36.4 44.5  Platelets 150 - 400 K/uL 221 257    . CMP Latest Ref Rng & Units 05/04/2020 05/03/2020  Glucose 70 - 99 mg/dL 573(U) 202(R)  BUN 8 - 23 mg/dL 15 20  Creatinine 4.27 - 1.00 mg/dL 0.62(B) 7.62(G)  Sodium 135 - 145 mmol/L 137 137  Potassium 3.5 - 5.1 mmol/L 3.3(L) 2.9(L)  Chloride 98 - 111 mmol/L 105 98  CO2 22 - 32 mmol/L 22 25  Calcium 8.9 - 10.3 mg/dL 9.5 31.5  Total Protein 6.5 - 8.1 g/dL - 8.5(H)  Total Bilirubin 0.3 - 1.2 mg/dL - 1.2  Alkaline Phos 38 - 126 U/L - 70  AST 15 - 41 U/L - 55(H)  ALT 0 - 44 U/L - 56(H)   Studies/Results: DG Chest 2 View  Result Date: 05/03/2020 CLINICAL DATA:  Epigastric pain. EXAM: CHEST - 2 VIEW COMPARISON:  None. FINDINGS: The cardiac silhouette is normal in size. There is a moderate-sized hiatal hernia. There is mild elevation of the right hemidiaphragm. No airspace consolidation, edema, pleural effusion, or pneumothorax is identified. No acute osseous abnormality is seen. IMPRESSION: Hiatal hernia.  No evidence of acute cardiopulmonary disease. Electronically Signed   By: Sebastian Ache M.D.   On: 05/03/2020 16:54   DG Abd 1 View  Result Date: 05/04/2020 CLINICAL DATA:  NG tube placement EXAM: ABDOMEN - 1 VIEW COMPARISON:  None. FINDINGS: Further retraction of NG tube which remains within the hiatal hernia. IMPRESSION: Further retraction of nasogastric tube which remains within the hiatal hernia. Electronically Signed   By: Deatra Robinson M.D.   On: 05/04/2020 02:54   DG Abd 1 View  Result Date: 05/03/2020 CLINICAL DATA:  Nasogastric tube placement. EXAM: ABDOMEN - 1 VIEW COMPARISON:  Radiograph earlier today chest CTA earlier today. FINDINGS: Enteric tube has been retracted and now appears coiled above the diaphragm within hiatal hernia. No small bowel dilatation. Excreted IV contrast within both renal collecting systems and urinary bladder. IMPRESSION: Enteric tube has been retracted and now appears coiled above the diaphragm within hiatal hernia. Electronically Signed   By: Narda Rutherford M.D.   On: 05/03/2020 23:12   DG Abdomen 1 View  Result Date: 05/03/2020 CLINICAL DATA:  NG tube placed EXAM: ABDOMEN - 1 VIEW COMPARISON:  CT angiography of the chest, abdomen and pelvis 05/03/2020 FINDINGS: Transesophageal tube is seen coiling over a gastric air lucency in the left upper quadrant below the level of the hemidiaphragm likely beyond the partially obstructive hiatal hernia seen on comparison CT. No other high-grade obstructive bowel gas pattern is seen. Excreted contrast  media seen within the collecting system. No other acute osseous or soft tissue abnormality. IMPRESSION: Transesophageal tube coils over the gastric air lucency in the left upper quadrant below the level of the hemidiaphragm likely beyond the partially obstructive hiatal hernia seen on comparison CT. Electronically Signed   By: Kreg ShropshirePrice  DeHay M.D.   On: 05/03/2020 20:51   CT Angio Chest/Abd/Pel for Dissection W and/or W/WO  Result Date: 05/03/2020 CLINICAL DATA:  Abdominal pain. Concern for aortic dissection.  Left upper quadrant pain. Epigastric pain. EXAM: CT ANGIOGRAPHY CHEST, ABDOMEN AND PELVIS TECHNIQUE: Non-contrast CT of the chest was initially obtained. Multidetector CT imaging through the chest, abdomen and pelvis was performed using the standard protocol during bolus administration of intravenous contrast. Multiplanar reconstructed images and MIPs were obtained and reviewed to evaluate the vascular anatomy. CONTRAST:  100mL OMNIPAQUE IOHEXOL 350 MG/ML SOLN COMPARISON:  CT dated 02/03/2009 FINDINGS: CTA CHEST FINDINGS Cardiovascular: There is no evidence for a thoracic aortic dissection. There is no evidence for thoracic aortic aneurysm. There are atherosclerotic changes of the thoracic aorta. The arch vessels are patent where visualized. There are coronary artery calcifications. The heart size is unremarkable. There is no significant pericardial effusion. There is no large centrally located pulmonary embolism. Detection of smaller pulmonary emboli is limited by study technique. Mediastinum/Nodes: -- No mediastinal lymphadenopathy. -- No hilar lymphadenopathy. -- No axillary lymphadenopathy. -- No supraclavicular lymphadenopathy. -- Normal thyroid gland where visualized. -the esophagus is fluid-filled to the upper thorax. Lungs/Pleura: There is atelectasis at the left lung base. There is no pneumothorax. No large pleural effusion. Musculoskeletal: No chest wall abnormality. No bony spinal canal stenosis. Review of the MIP images confirms the above findings. CTA ABDOMEN AND PELVIS FINDINGS VASCULAR Aorta: Normal caliber aorta without aneurysm, dissection, vasculitis or significant stenosis. Celiac: Patent without evidence of aneurysm, dissection, vasculitis or significant stenosis. SMA: Patent without evidence of aneurysm, dissection, vasculitis or significant stenosis. 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 The right renal artery demonstrates mild stenosis. IMA: Patent without evidence  of aneurysm, dissection, vasculitis or significant stenosis. Inflow: Patent without evidence of aneurysm, dissection, vasculitis or significant stenosis. Veins: No obvious venous abnormality within the limitations of this arterial phase study. Review of the MIP images confirms the above findings. NON-VASCULAR Hepatobiliary: There is probable underlying hepatic steatosis. Normal gallbladder.There is no biliary ductal dilation. Pancreas: Normal contours without ductal dilatation. No peripancreatic fluid collection. Spleen: Unremarkable. Adrenals/Urinary Tract: --Adrenal glands: Unremarkable. --Right kidney/ureter: There are multiple cysts involving the right kidney. --Left kidney/ureter: Left kidney is atrophic. There are multiple cysts. --Urinary bladder: Unremarkable. Stomach/Bowel: --Stomach/Duodenum: There is a large hiatal hernia. The configuration of the stomach is suspicious for a mesenteroaxial gastric volvulus. The GE junction appears to be below the diaphragm while the gastric antrum appears to be above the left hemidiaphragm. This configuration appears to be at least partially obstructing as there is an air-fluid level within the stomach both within the portions of bowel of the diaphragm and below the diaphragm. --Small bowel: Unremarkable. --Colon: Unremarkable. --Appendix: Not visualized. No right lower quadrant inflammation or free fluid. Vascular/Lymphatic: Atherosclerotic calcification is present within the non-aneurysmal abdominal aorta, without hemodynamically significant stenosis. --No retroperitoneal lymphadenopathy. --No mesenteric lymphadenopathy. --No pelvic or inguinal lymphadenopathy. Reproductive: There is a cystic mass in the right hemipelvis measuring approximately 3.8 cm. Is difficult to determine if this is associated with the right ovary or separate from the right ovary. Uterine fibroids are noted. Other: No ascites or  free air. The abdominal wall is normal. Musculoskeletal. No acute  displaced fractures. Review of the MIP images confirms the above findings. IMPRESSION: 1. No evidence for aortic dissection. 2. 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 This process appears to be at least partially obstructing. As such, surgical consultation is recommended. 3. Atrophic left kidney. 4. There is a 3.8 cm cystic mass in the right hemipelvis. Is difficult to determine if this is associated with the right ovary or separate from the right ovary. Follow-up with a nonemergent outpatient pelvic ultrasound is recommended. 5. Coronary artery calcifications. Aortic Atherosclerosis (ICD10-I70.0). Electronically Signed   By: Katherine Mantle M.D.   On: 05/03/2020 18:11    Active Problems:   Acute gastric volvulus    Willette Cluster, NP-C @  05/04/2020, 8:56 AM   Attending physician's note   I have taken a history, examined the patient and reviewed the chart. I agree with the Advanced Practitioner's note, impression and recommendations.  70 yr old very pleasant female with large hiatal hernia and acute gastric volvulus Plan to proceed with EGD to decompress the volvulus Will need follow-up with surgery for hiatal hernia repair and gastropexy to prevent recurrent volvulus  The risks and benefits as well as alternatives of endoscopic procedure(s) have been discussed and reviewed. All questions answered. The patient agrees to proceed.   The patient was provided an opportunity to ask questions and all were answered. The patient agreed with the plan and demonstrated an understanding of the instructions.  Iona Beard , MD [phone]

## 2020-05-04 NOTE — Op Note (Addendum)
[facility] Patient Name: Monica Lara Procedure Date: 05/04/2020 MRN: [id number] Attending MD: Napoleon Form , MD Date of Birth: 08/06/1950 CSN: [id number] Age: 70 Admit Type: Inpatient Procedure:                Upper GI endoscopy Indications:              Abdominal pain in the left upper quadrant, Abnormal                            CT of the GI tract, Vomiting. Acute gastric                            volvulus. Providers:                Napoleon Form, MD, Estella Husk, RN, Tillie Fantasia, RN, Rosilyn Mings, Technician, Michele Mcalpine Technician Referring MD:              Medicines:                General Anesthesia Complications:            No immediate complications. Estimated Blood Loss:     Estimated blood loss was minimal. Procedure:                Pre-Anesthesia Assessment:                           - Prior to the procedure, a History and Physical                            was performed, and patient medications and                            allergies were reviewed. The patient's tolerance of                            previous anesthesia was also reviewed. The risks                            and benefits of the procedure and the sedation                            options and risks were discussed with the patient.                            All questions were answered, and informed consent                            was obtained. Prior Anticoagulants: The patient                            last  took Lovenox (enoxaparin) 1 day prior to the                            procedure. ASA Grade Assessment: II - A patient                            with mild systemic disease. After reviewing the                            risks and benefits, the patient was deemed in                            satisfactory condition to undergo the procedure.                           After obtaining informed consent,  the endoscope was                            passed under direct vision. Throughout the                            procedure, the patient's blood pressure, pulse, and                            oxygen saturations were monitored continuously. The                            GIF-H190 ([id number]) Olympus gastroscope was                            introduced through the mouth, and advanced to the                            second part of duodenum. The upper GI endoscopy was                            accomplished without difficulty. The patient                            tolerated the procedure well. Scope In: Scope Out: Findings:      LA Grade B (one or more mucosal breaks greater than 5 mm, not extending       between the tops of two mucosal folds) esophagitis with no bleeding was       found 34 to 35 cm from the incisors.      The gastroesophageal flap valve was visualized endoscopically and       classified as Hill Grade III (minimal fold, loose to endoscope, hiatal       hernia likely).      A large hiatal hernia was present. Decompression of the volvulus       involving the gastric antrum was attempted and was successful, with       complete decompression achieved. Following the maneuver, a NG tube was       placed to maintain the decompression.  The cardia and gastric fundus were normal on retroflexion.      The exam of the stomach was otherwise normal.      The examined duodenum was normal. Impression:               - LA Grade B reflux esophagitis with no bleeding.                           - Gastroesophageal flap valve classified as Hill                            Grade III (minimal fold, loose to endoscope, hiatal                            hernia likely).                           - Large hiatal hernia.                           - Normal examined duodenum.                           - No specimens collected. Moderate Sedation:      Not Applicable - Patient had care per  Anesthesia. Recommendation:           - Patient has a contact number available for                            emergencies. The signs and symptoms of potential                            delayed complications were discussed with the                            patient. Return to normal activities tomorrow.                            Written discharge instructions were provided to the                            patient.                           - NPO. Slowly advance diet as tolerated                           - NG tube                           - Continue present medications.                           - Follow up with surgery for repair of large                            paraesophageal hiatal and gastropexy to prevent  recurrent gastric volvulus                           - GI will sign off, available if have any questions Procedure Code(s):        --- Professional ---                           [phone], Esophagogastroduodenoscopy, flexible,                            transoral; diagnostic, including collection of                            specimen(s) by brushing or washing, when performed                            (separate procedure) Diagnosis Code(s):        --- Professional ---                           K21.00, Gastro-esophageal reflux disease with                            esophagitis, without bleeding                           K44.9, Diaphragmatic hernia without obstruction or                            gangrene                           R10.12, Left upper quadrant pain                           R11.10, Vomiting, unspecified                           R93.3, Abnormal findings on diagnostic imaging of                            other parts of digestive tract CPT copyright 2019 [employer]. All rights reserved. The codes documented in this report are preliminary and upon coder review may  be revised to meet current compliance  requirements. Napoleon FormKavitha V. Jacquline Terrill, MD 05/04/2020 12:02:25 PM This report has been signed electronically. Number of Addenda: 0

## 2020-05-04 NOTE — Progress Notes (Signed)
Day of Surgery    CC: Abdominal pain  Subjective: Appreciate Dr. Elana Alm urgent consult,and getting her to the endoscopy lab so quickly. She is back in the room now and feels better, but not happy with the NG.  She says this has never happened to her before.  Objective: Vital signs in last 24 hours: Temp:  [97.7 F (36.5 C)-98.7 F (37.1 C)] 97.9 F (36.6 C) (03/08 1048) Pulse Rate:  [81-110] 81 (03/08 1048) Resp:  [15-20] 15 (03/08 1048) BP: (125-181)/(75-93) 168/87 (03/08 1048) SpO2:  [95 %-99 %] 99 % (03/08 1048) Weight:  [63.5 kg] 63.5 kg (03/07 1608) Last BM Date: 05/03/20 N.p.o.  421 IV No  output recorded Afebrile vital signs are stable blood pressure was up. Creatinine 1.05>> 1.6 WBC 7.2 H/H 15.4/44.5>> 12.2/36.4 Covid negative  Intake/Output from previous day: 03/07 0701 - 03/08 0700 In: 424.1 [IV Piggyback:424.1] Out: 0  Intake/Output this shift: No intake/output data recorded.  General appearance: alert, cooperative, no distress and very uncomfortable with the NG GI: soft, not tender, BS hypoactive  Lab Results:  Recent Labs    05/03/20 1620 05/04/20 0520  WBC 6.1 7.2  HGB 15.4* 12.2  HCT 44.5 36.4  PLT 257 221    BMET Recent Labs    05/03/20 1620 05/04/20 0520  NA 137 137  K 2.9* 3.3*  CL 98 105  CO2 25 22  GLUCOSE 112* 137*  BUN 20 15  CREATININE 1.05* 1.06*  CALCIUM 10.3 9.5   PT/INR No results for input(s): LABPROT, INR in the last 72 hours.  Recent Labs  Lab 05/03/20 1704  AST 55*  ALT 56*  ALKPHOS 70  BILITOT 1.2  PROT 8.5*  ALBUMIN 4.7     Lipase     Component Value Date/Time   LIPASE 32 05/03/2020 1704   Prior to Admission medications   Medication Sig Start Date End Date Taking? Authorizing Provider  telmisartan-hydrochlorothiazide (MICARDIS HCT) 40-12.5 MG tablet Take 1 tablet by mouth daily. 08/21/19  Yes [provider]      Medications: . [MAR Hold] enoxaparin (LOVENOX) injection  40 mg  Subcutaneous Q24H   . sodium chloride    . dextrose 5 % and 0.45 % NaCl with KCl 20 mEq/L 75 mL/hr at 05/03/20 2221    Assessment/Plan History of hypertension Hx renal artery stenosis Elevated LFTs 3.8 cm cystic mass  Gastric volvulus with outlet obstruction EGD with decompression of a gastric volvulus/NG tube placement 05/04/2020, Dr.Kavitha V. Nandigam   -LA grade B reflux esophagitis with no bleeding  -Grade 3 hiatal hernia with normal duodenum/stomach  FEN: N.p.o./IV fluid ID: None DVT: SCD  Plan:  Review with Dr. Daphine Deutscher.  Dr. Lavon Paganini thinks this is chronic, most of her stomach remains in the chest.  It is her opinion she needs surgery while here in hospital.  Dr. Daphine Deutscher will see later today.       LOS: 1 day    JENNINGS,WILLARD 05/04/2020 Please see Amion

## 2020-05-04 NOTE — Anesthesia Procedure Notes (Signed)
Performed by: Jimy Gates D, CRNA       

## 2020-05-04 NOTE — Anesthesia Procedure Notes (Signed)
Procedure Name: Intubation Date/Time: 05/04/2020 11:38 AM Performed by: Delories Mauri D, CRNA Pre-anesthesia Checklist: Patient identified, Emergency Drugs available, Suction available and Patient being monitored Patient Re-evaluated:Patient Re-evaluated prior to induction Oxygen Delivery Method: Circle system utilized Preoxygenation: Pre-oxygenation with 100% oxygen Induction Type: IV induction and Rapid sequence Laryngoscope Size: Mac and 4 Grade View: Grade I Tube type: Oral Tube size: 7.0 mm Number of attempts: 1 Airway Equipment and Method: Stylet Placement Confirmation: ETT inserted through vocal cords under direct vision,  positive ETCO2 and breath sounds checked- equal and bilateral Secured at: 21 cm Tube secured with: Tape Dental Injury: Teeth and Oropharynx as per pre-operative assessment

## 2020-05-04 NOTE — Transfer of Care (Signed)
Immediate Anesthesia Transfer of Care Note  Patient: Monica Lara  Procedure(s) Performed: ESOPHAGOGASTRODUODENOSCOPY (EGD) WITH PROPOFOL (N/A )  Patient Location: PACU  Anesthesia Type:General  Level of Consciousness: awake, alert  and oriented  Airway & Oxygen Therapy: Patient Spontanous Breathing and Patient connected to face mask oxygen  Post-op Assessment: Report given to RN and Post -op Vital signs reviewed and stable  Post vital signs: Reviewed and stable  Last Vitals:  Vitals Value Taken Time  BP 174/95 05/04/20 1203  Temp    Pulse 97 05/04/20 1207  Resp 13 05/04/20 1207  SpO2 100 % 05/04/20 1207  Vitals shown include unvalidated device data.  Last Pain:  Vitals:   05/04/20 1048  TempSrc: Temporal  PainSc: 0-No pain      Patients Stated Pain Goal: 2 (05/03/20 2216)  Complications: No complications documented.

## 2020-05-04 NOTE — Anesthesia Preprocedure Evaluation (Addendum)
Anesthesia Evaluation  Patient identified by MRN, date of birth, ID band Patient awake    Reviewed: Allergy & Precautions, NPO status , Patient's Chart, lab work & pertinent test results  History of Anesthesia Complications Negative for: history of anesthetic complications  Airway Mallampati: II  TM Distance: >3 FB Neck ROM: Full    Dental  (+) Dental Advisory Given   Pulmonary  05/03/2020 SARS coronavirus NEG   breath sounds clear to auscultation       Cardiovascular hypertension, Pt. on medications (-) angina Rhythm:Regular Rate:Normal     Neuro/Psych negative neurological ROS     GI/Hepatic Elevated LFTs abd pain with large volvulus: pain relieved with NG tube   Endo/Other  negative endocrine ROS  Renal/GU negative Renal ROS     Musculoskeletal   Abdominal   Peds  Hematology negative hematology ROS (+)   Anesthesia Other Findings   Reproductive/Obstetrics                            Anesthesia Physical Anesthesia Plan  ASA: III  Anesthesia Plan: General   Post-op Pain Management:    Induction: Intravenous and Rapid sequence  PONV Risk Score and Plan: 3 and Ondansetron, Treatment may vary due to age or medical condition and Dexamethasone  Airway Management Planned: Oral ETT  Additional Equipment: None  Intra-op Plan:   Post-operative Plan: Extubation in OR  Informed Consent: I have reviewed the patients History and Physical, chart, labs and discussed the procedure including the risks, benefits and alternatives for the proposed anesthesia with the patient or authorized representative who has indicated his/her understanding and acceptance.     Dental advisory given  Plan Discussed with: CRNA and Surgeon  Anesthesia Plan Comments:        Anesthesia Quick Evaluation

## 2020-05-05 ENCOUNTER — Encounter (HOSPITAL_COMMUNITY): Payer: Self-pay | Admitting: Gastroenterology

## 2020-05-05 ENCOUNTER — Inpatient Hospital Stay (HOSPITAL_COMMUNITY): Payer: Medicare PPO

## 2020-05-05 MED ORDER — IOHEXOL 300 MG/ML  SOLN
150.0000 mL | Freq: Once | INTRAMUSCULAR | Status: AC | PRN
  Administered 2020-05-05: 150 mL via ORAL

## 2020-05-05 MED ORDER — CEFAZOLIN SODIUM-DEXTROSE 2-4 GM/100ML-% IV SOLN
2.0000 g | INTRAVENOUS | Status: AC
  Administered 2020-05-06: 2 g via INTRAVENOUS
  Filled 2020-05-05: qty 100

## 2020-05-05 MED ORDER — PANTOPRAZOLE SODIUM 40 MG IV SOLR
40.0000 mg | Freq: Two times a day (BID) | INTRAVENOUS | Status: DC
  Administered 2020-05-05 – 2020-05-08 (×6): 40 mg via INTRAVENOUS
  Filled 2020-05-05 (×6): qty 40

## 2020-05-05 NOTE — Progress Notes (Addendum)
1 Day Post-Op    CC: Abdominal pain  Subjective: NG canister is about half full.  No abdominal pain.  She has completed the upper GI and results are listed below.  Objective: Vital signs in last 24 hours: Temp:  [97.3 F (36.3 C)-98.9 F (37.2 C)] 98.4 F (36.9 C) (03/09 0643) Pulse Rate:  [79-91] 85 (03/09 0643) Resp:  [9-18] 18 (03/09 0643) BP: (134-174)/(66-96) 134/88 (03/09 0643) SpO2:  [96 %-100 %] 96 % (03/09 0643) Last BM Date: 05/03/20 240 p.o. 1642 IV 100 urine recorded 300 NG No BM record Afebrile, vital signs are stable No labs this a.m. 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 They also noted some esophageal dysmotility.   Intake/Output from previous day: 03/08 0701 - 03/09 0700 In: 1882.9 [P.O.:240; I.V.:1642.9] Out: 400 [Urine:100; Emesis/NG output:300] Intake/Output this shift: No intake/output data recorded.  General appearance: alert, cooperative and no distress Resp: clear to auscultation bilaterally GI: soft, non-tender; bowel sounds normal; no masses,  no organomegaly and Her only complaint is the NG tube right now.  Lab Results:  Recent Labs    05/03/20 1620 05/04/20 0520  WBC 6.1 7.2  HGB 15.4* 12.2  HCT 44.5 36.4  PLT 257 221    BMET Recent Labs    05/03/20 1620 05/04/20 0520  NA 137 137  K 2.9* 3.3*  CL 98 105  CO2 25 22  GLUCOSE 112* 137*  BUN 20 15  CREATININE 1.05* 1.06*  CALCIUM 10.3 9.5   PT/INR No results for input(s): LABPROT, INR in the last 72 hours.  Recent Labs  Lab 05/03/20 1704  AST 55*  ALT 56*  ALKPHOS 70  BILITOT 1.2  PROT 8.5*  ALBUMIN 4.7     Lipase     Component Value Date/Time   LIPASE 32 05/03/2020 1704     Medications: . enoxaparin (LOVENOX) injection  40 mg Subcutaneous Q24H    Assessment/Plan History of hypertension Hx renal artery stenosis Elevated LFTs 3.8 cm cystic  mass  Gastric volvulus with outlet obstruction EGD with decompression of a gastric volvulus/NG tube placement 05/04/2020, Dr.Kavitha V. Nandigam   -LA grade B reflux esophagitis with no bleeding  - Protonix added 3/9  -Grade 3 hiatal hernia with normal duodenum/stomach  FEN: N.p.o./IV fluid ID: None DVT: SCD  Plan:  Reviewed with Dr. Daphine Deutscher, and plan surgery tomorrow.        LOS: 2 days    Monica Lara 05/05/2020 Please see Amion

## 2020-05-05 NOTE — H&P (View-Only) (Signed)
1 Day Post-Op    CC: Abdominal pain  Subjective: NG canister is about half full.  No abdominal pain.  She has completed the upper GI and results are listed below.  Objective: Vital signs in last 24 hours: Temp:  [97.3 F (36.3 C)-98.9 F (37.2 C)] 98.4 F (36.9 C) (03/09 0643) Pulse Rate:  [79-91] 85 (03/09 0643) Resp:  [9-18] 18 (03/09 0643) BP: (134-174)/(66-96) 134/88 (03/09 0643) SpO2:  [96 %-100 %] 96 % (03/09 0643) Last BM Date: 05/03/20 240 p.o. 1642 IV 100 urine recorded 300 NG No BM record Afebrile, vital signs are stable No labs this a.m. 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 They also noted some esophageal dysmotility.   Intake/Output from previous day: 03/08 0701 - 03/09 0700 In: 1882.9 [P.O.:240; I.V.:1642.9] Out: 400 [Urine:100; Emesis/NG output:300] Intake/Output this shift: No intake/output data recorded.  General appearance: alert, cooperative and no distress Resp: clear to auscultation bilaterally GI: soft, non-tender; bowel sounds normal; no masses,  no organomegaly and Her only complaint is the NG tube right now.  Lab Results:  Recent Labs    05/03/20 1620 05/04/20 0520  WBC 6.1 7.2  HGB 15.4* 12.2  HCT 44.5 36.4  PLT 257 221    BMET Recent Labs    05/03/20 1620 05/04/20 0520  NA 137 137  K 2.9* 3.3*  CL 98 105  CO2 25 22  GLUCOSE 112* 137*  BUN 20 15  CREATININE 1.05* 1.06*  CALCIUM 10.3 9.5   PT/INR No results for input(s): LABPROT, INR in the last 72 hours.  Recent Labs  Lab 05/03/20 1704  AST 55*  ALT 56*  ALKPHOS 70  BILITOT 1.2  PROT 8.5*  ALBUMIN 4.7     Lipase     Component Value Date/Time   LIPASE 32 05/03/2020 1704     Medications: . enoxaparin (LOVENOX) injection  40 mg Subcutaneous Q24H    Assessment/Plan History of hypertension Hx renal artery stenosis Elevated LFTs 3.8 cm cystic  mass  Gastric volvulus with outlet obstruction EGD with decompression of a gastric volvulus/NG tube placement 05/04/2020, Dr.Kavitha V. Nandigam   -LA grade B reflux esophagitis with no bleeding  - Protonix added 3/9  -Grade 3 hiatal hernia with normal duodenum/stomach  FEN: N.p.o./IV fluid ID: None DVT: SCD  Plan:  Reviewed with Dr. Martin, and plan surgery tomorrow.        LOS: 2 days    Doyl Bitting 05/05/2020 Please see Amion  

## 2020-05-06 ENCOUNTER — Encounter (HOSPITAL_COMMUNITY): Admission: EM | Disposition: A | Discharge status: Home / Self Care | Payer: Self-pay | Source: Home / Self Care

## 2020-05-06 ENCOUNTER — Inpatient Hospital Stay (HOSPITAL_COMMUNITY): Payer: Medicare PPO | Admitting: Anesthesiology

## 2020-05-06 ENCOUNTER — Encounter (HOSPITAL_COMMUNITY): Payer: Self-pay

## 2020-05-06 HISTORY — PX: HIATAL HERNIA REPAIR: SHX195

## 2020-05-06 HISTORY — PX: LAPAROSCOPIC NISSEN FUNDOPLICATION: SHX1932

## 2020-05-06 LAB — CBC
HCT: 37 % (ref 36.0–46.0)
Hemoglobin: 12.2 g/dL (ref 12.0–15.0)
MCH: 29.7 pg (ref 26.0–34.0)
MCHC: 33 g/dL (ref 30.0–36.0)
MCV: 90 fL (ref 80.0–100.0)
Platelets: 207 10*3/uL (ref 150–400)
RBC: 4.11 MIL/uL (ref 3.87–5.11)
RDW: 12.5 % (ref 11.5–15.5)
WBC: 7 10*3/uL (ref 4.0–10.5)
nRBC: 0 % (ref 0.0–0.2)

## 2020-05-06 LAB — PROTIME-INR
INR: 1 (ref 0.8–1.2)
Prothrombin Time: 12.9 seconds (ref 11.4–15.2)

## 2020-05-06 LAB — COMPREHENSIVE METABOLIC PANEL
ALT: 32 U/L (ref 0–44)
AST: 26 U/L (ref 15–41)
Albumin: 3.6 g/dL (ref 3.5–5.0)
Alkaline Phosphatase: 57 U/L (ref 38–126)
Anion gap: 8 (ref 5–15)
BUN: 10 mg/dL (ref 8–23)
CO2: 24 mmol/L (ref 22–32)
Calcium: 8.6 mg/dL — ABNORMAL LOW (ref 8.9–10.3)
Chloride: 104 mmol/L (ref 98–111)
Creatinine, Ser: 0.9 mg/dL (ref 0.44–1.00)
GFR, Estimated: >60 mL/min (ref >60)
Glucose, Bld: 121 mg/dL — ABNORMAL HIGH (ref 70–99)
Potassium: 3.6 mmol/L (ref 3.5–5.1)
Sodium: 136 mmol/L (ref 135–145)
Total Bilirubin: 2 mg/dL — ABNORMAL HIGH (ref 0.3–1.2)
Total Protein: 6.7 g/dL (ref 6.5–8.1)

## 2020-05-06 LAB — MRSA PCR SCREENING: MRSA by PCR: NEGATIVE

## 2020-05-06 LAB — ABO/RH: ABO/RH(D): O POS

## 2020-05-06 LAB — PREALBUMIN: Prealbumin: 19.4 mg/dL (ref 18–38)

## 2020-05-06 LAB — TYPE AND SCREEN
ABO/RH(D): O POS
Antibody Screen: NEGATIVE

## 2020-05-06 SURGERY — REPAIR, HERNIA, HIATAL, LAPAROSCOPIC
Anesthesia: General

## 2020-05-06 MED ORDER — KETAMINE HCL 10 MG/ML IJ SOLN
INTRAMUSCULAR | Status: AC
  Filled 2020-05-06: qty 1

## 2020-05-06 MED ORDER — LIDOCAINE 2% (20 MG/ML) 5 ML SYRINGE
INTRAMUSCULAR | Status: AC
  Filled 2020-05-06: qty 5

## 2020-05-06 MED ORDER — HYDROMORPHONE HCL 1 MG/ML IJ SOLN
INTRAMUSCULAR | Status: AC
  Administered 2020-05-06: 0.5 mg via INTRAVENOUS
  Filled 2020-05-06: qty 1

## 2020-05-06 MED ORDER — FENTANYL CITRATE (PF) 100 MCG/2ML IJ SOLN
INTRAMUSCULAR | Status: AC
  Filled 2020-05-06: qty 2

## 2020-05-06 MED ORDER — BUPIVACAINE LIPOSOME 1.3 % IJ SUSP
INTRAMUSCULAR | Status: DC | PRN
  Administered 2020-05-06: 20 mL

## 2020-05-06 MED ORDER — DEXAMETHASONE SODIUM PHOSPHATE 10 MG/ML IJ SOLN
INTRAMUSCULAR | Status: AC
  Filled 2020-05-06: qty 1

## 2020-05-06 MED ORDER — ENOXAPARIN SODIUM 40 MG/0.4ML ~~LOC~~ SOLN
40.0000 mg | SUBCUTANEOUS | Status: DC
  Administered 2020-05-07 – 2020-05-08 (×2): 40 mg via SUBCUTANEOUS
  Filled 2020-05-06 (×2): qty 0.4

## 2020-05-06 MED ORDER — ONDANSETRON HCL 4 MG/2ML IJ SOLN
INTRAMUSCULAR | Status: DC | PRN
  Administered 2020-05-06: 4 mg via INTRAVENOUS

## 2020-05-06 MED ORDER — ONDANSETRON HCL 4 MG/2ML IJ SOLN
INTRAMUSCULAR | Status: AC
  Filled 2020-05-06: qty 2

## 2020-05-06 MED ORDER — PROPOFOL 10 MG/ML IV BOLUS
INTRAVENOUS | Status: AC
  Filled 2020-05-06: qty 20

## 2020-05-06 MED ORDER — BUPIVACAINE LIPOSOME 1.3 % IJ SUSP
20.0000 mL | Freq: Once | INTRAMUSCULAR | Status: DC
  Filled 2020-05-06: qty 20

## 2020-05-06 MED ORDER — PROPOFOL 10 MG/ML IV BOLUS
INTRAVENOUS | Status: DC | PRN
  Administered 2020-05-06: 120 mg via INTRAVENOUS
  Administered 2020-05-06: 50 mg via INTRAVENOUS

## 2020-05-06 MED ORDER — SODIUM CHLORIDE (PF) 0.9 % IJ SOLN
INTRAMUSCULAR | Status: DC | PRN
  Administered 2020-05-06: 10 mL via INTRAVENOUS

## 2020-05-06 MED ORDER — SUCCINYLCHOLINE CHLORIDE 20 MG/ML IJ SOLN
INTRAMUSCULAR | Status: DC | PRN
  Administered 2020-05-06: 100 mg via INTRAVENOUS

## 2020-05-06 MED ORDER — MIDAZOLAM HCL 2 MG/2ML IJ SOLN
INTRAMUSCULAR | Status: DC | PRN
  Administered 2020-05-06: 2 mg via INTRAVENOUS

## 2020-05-06 MED ORDER — ONDANSETRON HCL 4 MG/2ML IJ SOLN: 4.0000 mg | Freq: Once | INTRAMUSCULAR | Status: DC | PRN

## 2020-05-06 MED ORDER — DEXAMETHASONE SODIUM PHOSPHATE 10 MG/ML IJ SOLN
INTRAMUSCULAR | Status: DC | PRN
  Administered 2020-05-06: 10 mg via INTRAVENOUS

## 2020-05-06 MED ORDER — SCOPOLAMINE 1 MG/3DAYS TD PT72
MEDICATED_PATCH | TRANSDERMAL | Status: AC
  Administered 2020-05-06: 1.5 mg via TRANSDERMAL
  Filled 2020-05-06: qty 1

## 2020-05-06 MED ORDER — MIDAZOLAM HCL 2 MG/2ML IJ SOLN
INTRAMUSCULAR | Status: AC
  Filled 2020-05-06: qty 2

## 2020-05-06 MED ORDER — ROCURONIUM BROMIDE 10 MG/ML (PF) SYRINGE
PREFILLED_SYRINGE | INTRAVENOUS | Status: DC | PRN
  Administered 2020-05-06: 20 mg via INTRAVENOUS
  Administered 2020-05-06: 10 mg via INTRAVENOUS
  Administered 2020-05-06: 40 mg via INTRAVENOUS

## 2020-05-06 MED ORDER — LACTATED RINGERS IR SOLN
Status: DC | PRN
  Administered 2020-05-06: 3000 mL

## 2020-05-06 MED ORDER — METHOCARBAMOL 1000 MG/10ML IJ SOLN
500.0000 mg | Freq: Three times a day (TID) | INTRAVENOUS | Status: DC
  Administered 2020-05-06 – 2020-05-08 (×7): 500 mg via INTRAVENOUS
  Filled 2020-05-06 (×7): qty 500

## 2020-05-06 MED ORDER — PHENYLEPHRINE 40 MCG/ML (10ML) SYRINGE FOR IV PUSH (FOR BLOOD PRESSURE SUPPORT)
PREFILLED_SYRINGE | INTRAVENOUS | Status: DC | PRN
  Administered 2020-05-06 (×3): 40 ug via INTRAVENOUS

## 2020-05-06 MED ORDER — ACETAMINOPHEN 10 MG/ML IV SOLN
1000.0000 mg | Freq: Four times a day (QID) | INTRAVENOUS | Status: AC
  Administered 2020-05-06 – 2020-05-07 (×4): 1000 mg via INTRAVENOUS
  Filled 2020-05-06 (×4): qty 100

## 2020-05-06 MED ORDER — KETAMINE HCL 10 MG/ML IJ SOLN
INTRAMUSCULAR | Status: DC | PRN
  Administered 2020-05-06: 30 mg via INTRAVENOUS

## 2020-05-06 MED ORDER — LACTATED RINGERS IV SOLN: INTRAVENOUS | Status: DC

## 2020-05-06 MED ORDER — ROCURONIUM BROMIDE 10 MG/ML (PF) SYRINGE
PREFILLED_SYRINGE | INTRAVENOUS | Status: AC
  Filled 2020-05-06: qty 10

## 2020-05-06 MED ORDER — HYDROMORPHONE HCL 1 MG/ML IJ SOLN
0.5000 mg | INTRAMUSCULAR | Status: DC | PRN
  Administered 2020-05-06 – 2020-05-07 (×2): 1 mg via INTRAVENOUS
  Filled 2020-05-06 (×2): qty 1

## 2020-05-06 MED ORDER — SODIUM CHLORIDE (PF) 0.9 % IJ SOLN
INTRAMUSCULAR | Status: AC
  Filled 2020-05-06: qty 10

## 2020-05-06 MED ORDER — SCOPOLAMINE 1 MG/3DAYS TD PT72: 1.0000 | MEDICATED_PATCH | TRANSDERMAL | Status: DC

## 2020-05-06 MED ORDER — PHENYLEPHRINE 40 MCG/ML (10ML) SYRINGE FOR IV PUSH (FOR BLOOD PRESSURE SUPPORT)
PREFILLED_SYRINGE | INTRAVENOUS | Status: AC
  Filled 2020-05-06: qty 10

## 2020-05-06 MED ORDER — LIDOCAINE 2% (20 MG/ML) 5 ML SYRINGE
INTRAMUSCULAR | Status: DC | PRN
  Administered 2020-05-06: 40 mg via INTRAVENOUS

## 2020-05-06 MED ORDER — SUGAMMADEX SODIUM 200 MG/2ML IV SOLN
INTRAVENOUS | Status: DC | PRN
  Administered 2020-05-06: 200 mg via INTRAVENOUS
  Administered 2020-05-06: 100 mg via INTRAVENOUS

## 2020-05-06 MED ORDER — ALBUMIN HUMAN 5 % IV SOLN: INTRAVENOUS | Status: DC | PRN

## 2020-05-06 MED ORDER — FENTANYL CITRATE (PF) 250 MCG/5ML IJ SOLN
INTRAMUSCULAR | Status: DC | PRN
  Administered 2020-05-06 (×2): 50 ug via INTRAVENOUS
  Administered 2020-05-06: 100 ug via INTRAVENOUS

## 2020-05-06 MED ORDER — HYDROMORPHONE HCL 1 MG/ML IJ SOLN
0.2500 mg | INTRAMUSCULAR | Status: DC | PRN
Start: 2020-05-06 — End: 2020-05-06
  Administered 2020-05-06 (×2): 0.5 mg via INTRAVENOUS

## 2020-05-06 MED ORDER — 0.9 % SODIUM CHLORIDE (POUR BTL) OPTIME
TOPICAL | Status: DC | PRN
  Administered 2020-05-06: 1000 mL

## 2020-05-06 SURGICAL SUPPLY — 56 items
APPLIER CLIP ROT 10 11.4 M/L (STAPLE)
BLADE SURG 15 STRL LF DISP TIS (BLADE) ×1 IMPLANT
BLADE SURG 15 STRL SS (BLADE) ×1
CABLE HIGH FREQUENCY MONO STRZ (ELECTRODE) IMPLANT
CLAMP ENDO BABCK 10MM (STAPLE) IMPLANT
CLIP APPLIE ROT 10 11.4 M/L (STAPLE) IMPLANT
COVER SURGICAL LIGHT HANDLE (MISCELLANEOUS) ×2 IMPLANT
COVER WAND RF STERILE (DRAPES) IMPLANT
DECANTER SPIKE VIAL GLASS SM (MISCELLANEOUS) ×2 IMPLANT
DERMABOND ADVANCED (GAUZE/BANDAGES/DRESSINGS) ×1
DERMABOND ADVANCED .7 DNX12 (GAUZE/BANDAGES/DRESSINGS) ×1 IMPLANT
DEVICE SUT QUICK LOAD TK 5 (STAPLE) ×5 IMPLANT
DEVICE SUT TI-KNOT TK 5X26 (MISCELLANEOUS) ×1 IMPLANT
DEVICE SUTURE ENDOST 10MM (ENDOMECHANICALS) ×2 IMPLANT
DISSECTOR BLUNT TIP ENDO 5MM (MISCELLANEOUS) ×2 IMPLANT
DRAIN PENROSE 0.5X18 (DRAIN) ×2 IMPLANT
ELECT L-HOOK LAP 45CM DISP (ELECTROSURGICAL)
ELECT REM PT RETURN 15FT ADLT (MISCELLANEOUS) ×2 IMPLANT
ELECTRODE L-HOOK LAP 45CM DISP (ELECTROSURGICAL) IMPLANT
GAUZE 4X4 16PLY RFD (DISPOSABLE) ×2 IMPLANT
GLOVE BIOGEL M 8.0 STRL (GLOVE) ×2 IMPLANT
GOWN STRL REIN XL XLG (GOWN DISPOSABLE) ×1 IMPLANT
GOWN STRL REUS W/TWL XL LVL3 (GOWN DISPOSABLE) ×6 IMPLANT
GRASPER ENDO BABCOCK 10 (MISCELLANEOUS) IMPLANT
GRASPER ENDO BABCOCK 10MM (MISCELLANEOUS)
KIT BASIN OR (CUSTOM PROCEDURE TRAY) ×2 IMPLANT
KIT TURNOVER KIT A (KITS) ×2 IMPLANT
NEEDLE HYPO 22GX1.5 SAFETY (NEEDLE) ×2 IMPLANT
PACK CARDIOVASCULAR III (CUSTOM PROCEDURE TRAY) ×2 IMPLANT
SCISSORS LAP 5X45 EPIX DISP (ENDOMECHANICALS) ×2 IMPLANT
SET IRRIG TUBING LAPAROSCOPIC (IRRIGATION / IRRIGATOR) ×2 IMPLANT
SET TUBE SMOKE EVAC HIGH FLOW (TUBING) ×2 IMPLANT
SHEARS HARMONIC ACE PLUS 45CM (MISCELLANEOUS) ×2 IMPLANT
SLEEVE ADV FIXATION 5X100MM (TROCAR) ×4 IMPLANT
SOL ANTI FOG 6CC (MISCELLANEOUS) IMPLANT
SOLUTION ANTI FOG 6CC (MISCELLANEOUS) ×1
STAPLER VISISTAT 35W (STAPLE) ×2 IMPLANT
SUT ETHIBOND 2 0 SH (SUTURE) ×2
SUT ETHIBOND 2 0 SH 36X2 (SUTURE) IMPLANT
SUT MNCRL AB 4-0 PS2 18 (SUTURE) IMPLANT
SUT SURGIDAC NAB ES-9 0 48 120 (SUTURE) ×7 IMPLANT
SUT VIC AB 4-0 SH 18 (SUTURE) IMPLANT
SYR 20ML LL LF (SYRINGE) ×2 IMPLANT
TAPE CLOTH 4X10 WHT NS (GAUZE/BANDAGES/DRESSINGS) IMPLANT
TIP INNERVISION DETACH 40FR (MISCELLANEOUS) IMPLANT
TIP INNERVISION DETACH 50FR (MISCELLANEOUS) IMPLANT
TIP INNERVISION DETACH 56FR (MISCELLANEOUS) IMPLANT
TIPS INNERVISION DETACH 40FR (MISCELLANEOUS)
TOWEL OR 17X26 10 PK STRL BLUE (TOWEL DISPOSABLE) ×2 IMPLANT
TOWEL OR NON WOVEN STRL DISP B (DISPOSABLE) ×2 IMPLANT
TRAY FOLEY MTR SLVR 16FR STAT (SET/KITS/TRAYS/PACK) ×1 IMPLANT
TRAY LAPAROSCOPIC (CUSTOM PROCEDURE TRAY) ×2 IMPLANT
TROCAR ADV FIXATION 11X100MM (TROCAR) ×2 IMPLANT
TROCAR ADV FIXATION 5X100MM (TROCAR) ×2 IMPLANT
TROCAR BLADELESS OPT 5 100 (ENDOMECHANICALS) ×2 IMPLANT
TROCAR XCEL UNIV SLVE 11M 100M (ENDOMECHANICALS) ×1 IMPLANT

## 2020-05-06 NOTE — Anesthesia Postprocedure Evaluation (Signed)
Anesthesia Post Note  Patient: Monica Lara  Procedure(s) Performed: LAPAROSCOPIC REPAIR OF HIATAL HERNIA (N/A ) POSSIBLE LAPAROSCOPIC NISSEN FUNDOPLICATION (N/A )     Patient location during evaluation: PACU Anesthesia Type: General Level of consciousness: awake and alert and oriented Pain management: pain level controlled Vital Signs Assessment: post-procedure vital signs reviewed and stable Respiratory status: spontaneous breathing, nonlabored ventilation and respiratory function stable Cardiovascular status: blood pressure returned to baseline and stable Postop Assessment: no apparent nausea or vomiting Anesthetic complications: no   No complications documented.  Last Vitals:  Vitals:   05/06/20 1300 05/06/20 1315  BP: (!) 159/104 (!) 161/91  Pulse: 92 78  Resp: 10 10  Temp:    SpO2: 99% 100%    Last Pain:  Vitals:   05/06/20 1315  TempSrc:   PainSc: Asleep                 Zai Chmiel A.

## 2020-05-06 NOTE — Interval H&P Note (Signed)
History and Physical Interval Note:  05/06/2020 9:28 AM  Monica Lara  has presented today for surgery, with the diagnosis of HIATAL HERNIA.  The various methods of treatment have been discussed with the patient and family. After consideration of risks, benefits and other options for treatment, the patient has consented to  Procedure(s): LAPAROSCOPIC REPAIR OF HIATAL HERNIA (N/A) POSSIBLE LAPAROSCOPIC NISSEN FUNDOPLICATION (N/A) as a surgical intervention.  The patient's history has been reviewed, patient examined, no change in status, stable for surgery.  I have reviewed the patient's chart and labs.  Questions were answered to the patient's satisfaction.     Valarie Merino

## 2020-05-06 NOTE — Op Note (Signed)
Monica Lara  1951-01-02   05/06/2020    PCP:  Jolene Provost, MD   Surgeon: Wenda Low, MD, FACS  Asst:  Kris Mouton, MD  Anes:  general  Preop Dx: Recently incarcerated large mixed hiatal hernia Postop Dx: Same, post reduction of hiatal hernia, closure of hiatus with three sutures posteriorly and gastropexy (2)  Procedure: Laparoscopic takedown of incarcerated large type III mixed hiatal hernia that had been strangulated acutely and reduced acutely.  Because of the residulal edema of the tissue we reduced the hernia, repaired the hiatus posteriorly and performed a gastropexy.  I also debulked the proximal stomach where the large sac was attached.  At least 3 cm of esophagus was in the abdomen at the completion.  I elected not to wrap because she had no antecedent reflux to speak of but more importantly she had a lot of tissue edema making accurate foregut reconstruction less precise. Location Surgery: [facility] OR 1 Complications: None  EBL:   20 cc  Drains: None  Description of Procedure:  The patient was taken to OR 1.  After anesthesia was administered and the patient was prepped  with ChloraPrep and a timeout was performed.  The operation began by placing a 5 mm Optiview in the upper left upper quadrant.  Initially went with 6 standard ports using 111 to the right of midline.  After operating for a while and and having degradation of the 5 mm scope I opted to the camera port to an 11 mm and use a 10 mm 30 degree scope.  This improved visualization.  As I got around behind the stomach and placed a Penrose drain and this facilitated the dissection.  With this in place I was able to see the right and left crura and then repair those with 4 Surgidek's placed and secured with tie knots.  Also the large hernia sac on the forgot was removed with harmonic scalpel and brought out through the 11 mm port.  I then went down where the stomach and had an organoaxial volvulus with  strangulation and brought the stomach down and did a anterior gastropexy with 2 sutures using 2 oh Surgidek placing this through the stomach and the anterior abdominal wall securing with tie knots.  Everything appeared to be in order when I completed the case.  I done a tap block with 30 cc of Exparel.  We then deflated the abdomen and closed the ports with 4-0 Monocryl and Dermabond.  The patient tolerated the procedure well and was taken to the PACU in stable condition.     Matt B. Daphine Deutscher, MD, [facility], [place] [phone]

## 2020-05-06 NOTE — Anesthesia Preprocedure Evaluation (Signed)
Anesthesia Evaluation  Patient identified by MRN, date of birth, ID band Patient awake    Reviewed: Allergy & Precautions, NPO status , Patient's Chart, lab work & pertinent test results, reviewed documented beta blocker date and time   Airway Mallampati: II  TM Distance: >3 FB Neck ROM: Full    Dental no notable dental hx. (+) Teeth Intact, Caps, Dental Advisory Given   Pulmonary neg pulmonary ROS,    Pulmonary exam normal breath sounds clear to auscultation       Cardiovascular hypertension, Pt. on medications Normal cardiovascular exam Rhythm:Regular Rate:Normal  EKG 05/04/20 NSR, poor r wave progression in V leads, cannot r/o anterior infarct, no old EKG for comparison   Neuro/Psych negative neurological ROS     GI/Hepatic Neg liver ROS, hiatal hernia, Hx/o acute gastric volvulus 2 days ago- reduced   Endo/Other  negative endocrine ROS  Renal/GU negative Renal ROS  negative genitourinary   Musculoskeletal negative musculoskeletal ROS (+)   Abdominal   Peds  Hematology negative hematology ROS (+)   Anesthesia Other Findings   Reproductive/Obstetrics negative OB ROS                             Anesthesia Physical Anesthesia Plan  ASA: II  Anesthesia Plan: General   Post-op Pain Management:    Induction: Intravenous and Cricoid pressure planned  PONV Risk Score and Plan: 4 or greater and Treatment may vary due to age or medical condition and Ondansetron  Airway Management Planned: Oral ETT  Additional Equipment: None  Intra-op Plan:   Post-operative Plan: Extubation in OR  Informed Consent: I have reviewed the patients History and Physical, chart, labs and discussed the procedure including the risks, benefits and alternatives for the proposed anesthesia with the patient or authorized representative who has indicated his/her understanding and acceptance.     Dental advisory  given  Plan Discussed with: CRNA and Anesthesiologist  Anesthesia Plan Comments:         Anesthesia Quick Evaluation

## 2020-05-06 NOTE — Anesthesia Procedure Notes (Signed)
Procedure Name: Intubation Date/Time: 05/06/2020 9:52 AM Performed by: Shary Decamp, CRNA Pre-anesthesia Checklist: Patient identified, Patient being monitored, Timeout performed, Emergency Drugs available and Suction available Patient Re-evaluated:Patient Re-evaluated prior to induction Oxygen Delivery Method: Circle System Utilized Preoxygenation: Pre-oxygenation with 100% oxygen Induction Type: IV induction, Rapid sequence and Cricoid Pressure applied Ventilation: Mask ventilation without difficulty Laryngoscope Size: Miller and 2 Grade View: Grade I Tube type: Oral Tube size: 7.0 mm Number of attempts: 1 Airway Equipment and Method: Stylet Placement Confirmation: ETT inserted through vocal cords under direct vision,  positive ETCO2 and breath sounds checked- equal and bilateral Secured at: 21 cm Tube secured with: Tape Dental Injury: Teeth and Oropharynx as per pre-operative assessment

## 2020-05-06 NOTE — Transfer of Care (Signed)
Immediate Anesthesia Transfer of Care Note  Patient: Monica Lara  Procedure(s) Performed: LAPAROSCOPIC REPAIR OF HIATAL HERNIA (N/A ) POSSIBLE LAPAROSCOPIC NISSEN FUNDOPLICATION (N/A )  Patient Location: PACU  Anesthesia Type:General  Level of Consciousness: drowsy, patient cooperative and responds to stimulation  Airway & Oxygen Therapy: Patient Spontanous Breathing and Patient connected to face mask oxygen  Post-op Assessment: Report given to RN, Post -op Vital signs reviewed and stable and Patient moving all extremities X 4  Post vital signs: Reviewed and stable  Last Vitals:  Vitals Value Taken Time  BP 170/98 05/06/20 1230  Temp    Pulse 100 05/06/20 1231  Resp 15 05/06/20 1231  SpO2 100 % 05/06/20 1231  Vitals shown include unvalidated device data.  Last Pain:  Vitals:   05/06/20 0914  TempSrc:   PainSc: 0-No pain      Patients Stated Pain Goal: 2 (05/06/20 0610)  Complications: No complications documented.

## 2020-05-07 ENCOUNTER — Encounter (HOSPITAL_COMMUNITY): Payer: Self-pay | Admitting: Surgery

## 2020-05-07 LAB — BASIC METABOLIC PANEL
Anion gap: 11 (ref 5–15)
BUN: 12 mg/dL (ref 8–23)
CO2: 21 mmol/L — ABNORMAL LOW (ref 22–32)
Calcium: 8.6 mg/dL — ABNORMAL LOW (ref 8.9–10.3)
Chloride: 104 mmol/L (ref 98–111)
Creatinine, Ser: 1 mg/dL (ref 0.44–1.00)
GFR, Estimated: >60 mL/min (ref >60)
Glucose, Bld: 115 mg/dL — ABNORMAL HIGH (ref 70–99)
Potassium: 3.9 mmol/L (ref 3.5–5.1)
Sodium: 136 mmol/L (ref 135–145)

## 2020-05-07 LAB — CBC WITH DIFFERENTIAL/PLATELET
Abs Immature Granulocytes: 0.04 10*3/uL (ref 0.00–0.07)
Basophils Absolute: 0 10*3/uL (ref 0.0–0.1)
Basophils Relative: 0 %
Eosinophils Absolute: 0 10*3/uL (ref 0.0–0.5)
Eosinophils Relative: 0 %
HCT: 34.6 % — ABNORMAL LOW (ref 36.0–46.0)
Hemoglobin: 11.5 g/dL — ABNORMAL LOW (ref 12.0–15.0)
Immature Granulocytes: 1 %
Lymphocytes Relative: 13 %
Lymphs Abs: 1.1 10*3/uL (ref 0.7–4.0)
MCH: 29.6 pg (ref 26.0–34.0)
MCHC: 33.2 g/dL (ref 30.0–36.0)
MCV: 89.2 fL (ref 80.0–100.0)
Monocytes Absolute: 1 10*3/uL (ref 0.1–1.0)
Monocytes Relative: 12 %
Neutro Abs: 6.1 10*3/uL (ref 1.7–7.7)
Neutrophils Relative %: 74 %
Platelets: 199 10*3/uL (ref 150–400)
RBC: 3.88 MIL/uL (ref 3.87–5.11)
RDW: 12.3 % (ref 11.5–15.5)
WBC: 8.2 10*3/uL (ref 4.0–10.5)
nRBC: 0 % (ref 0.0–0.2)

## 2020-05-07 NOTE — Progress Notes (Signed)
1 Day Post-Op    CC: Abdominal pain  Subjective: OR yesterday.  Sore today but pain controlled.  Mobilizing on her own some.  Voiding well with no issues.  No nausea.  No flatus.  Some belching  Objective: Vital signs in last 24 hours: Temp:  [97.3 F (36.3 C)-99.4 F (37.4 C)] 99.4 F (37.4 C) (03/11 0535) Pulse Rate:  [60-100] 66 (03/11 0535) Resp:  [9-18] 16 (03/11 0535) BP: (111-181)/(62-104) 132/78 (03/11 0535) SpO2:  [94 %-100 %] 95 % (03/11 0535) Last BM Date: 05/03/20   Intake/Output from previous day: 03/10 0701 - 03/11 0700 In: 3374.3 [I.V.:2624.6; IV Piggyback:749.7] Out: 750 [Urine:700; Blood:50] Intake/Output this shift: No intake/output data recorded.  PE: Abd: soft, appropriately tender, hypoactive BS, ND, incisions c/d/i with dermabond  Lab Results:  Recent Labs    05/06/20 0516 05/07/20 0525  WBC 7.0 8.2  HGB 12.2 11.5*  HCT 37.0 34.6*  PLT 207 199    BMET Recent Labs    05/06/20 0516 05/07/20 0525  NA 136 136  K 3.6 3.9  CL 104 104  CO2 24 21*  GLUCOSE 121* 115*  BUN 10 12  CREATININE 0.90 1.00  CALCIUM 8.6* 8.6*   PT/INR Recent Labs    05/06/20 0516  LABPROT 12.9  INR 1.0    Recent Labs  Lab 05/03/20 1704 05/06/20 0516  AST 55* 26  ALT 56* 32  ALKPHOS 70 57  BILITOT 1.2 2.0*  PROT 8.5* 6.7  ALBUMIN 4.7 3.6     Lipase     Component Value Date/Time   LIPASE 32 05/03/2020 1704   Prior to Admission medications   Medication Sig Start Date End Date Taking? Authorizing Provider  telmisartan-hydrochlorothiazide (MICARDIS HCT) 40-12.5 MG tablet Take 1 tablet by mouth daily. 08/21/19  Yes [provider]      Medications: . enoxaparin (LOVENOX) injection  40 mg Subcutaneous Q24H  . pantoprazole (PROTONIX) IV  40 mg Intravenous Q12H   . acetaminophen 1,000 mg (05/07/20 0630)  . dextrose 5 % and 0.45 % NaCl with KCl 20 mEq/L 75 mL/hr at 05/06/20 1421  . methocarbamol (ROBAXIN) IV 500 mg (05/07/20 0558)     Assessment/Plan History of hypertension Hx renal artery stenosis Elevated LFTs 3.8 cm cystic mass  POD 1, s/p reduction of hiatal hernia and closure of hiatus and gastropexy by Dr. Daphine Deutscher, 05/06/20 for Gastric volvulus with outlet obstruction EGD with decompression of a gastric volvulus/NG tube placement 05/04/2020, Dr.Kavitha V. Nandigam  -will give sips of clears -mobilize/pulm toilet/IS -IV tylenol, multi-modal pain control  FEN: sips of clears/IV fluid ID: None DVT: SCD, lovenox    LOS: 4 days    Letha Cape 05/07/2020 Please see Amion

## 2020-05-07 NOTE — Care Management Important Message (Signed)
Important Message  Patient Details IM Letter given to the Patient. Name: Monica Lara MRN: [id number] Date of Birth: [birth year]   Medicare Important Message Given:  Yes     Caren Macadam 05/07/2020, 11:07 AM

## 2020-05-08 ENCOUNTER — Inpatient Hospital Stay (HOSPITAL_COMMUNITY): Payer: Medicare PPO

## 2020-05-08 MED ORDER — SODIUM CHLORIDE 0.9% FLUSH: 3.0000 mL | INTRAVENOUS | Status: DC | PRN

## 2020-05-08 MED ORDER — FUROSEMIDE 10 MG/ML IJ SOLN
40.0000 mg | Freq: Once | INTRAMUSCULAR | Status: AC
  Administered 2020-05-08: 40 mg via INTRAVENOUS
  Filled 2020-05-08: qty 4

## 2020-05-08 MED ORDER — METHOCARBAMOL 1000 MG/10ML IJ SOLN
500.0000 mg | Freq: Four times a day (QID) | INTRAVENOUS | Status: DC | PRN
  Filled 2020-05-08: qty 5

## 2020-05-08 MED ORDER — MENTHOL 3 MG MT LOZG: 1.0000 | LOZENGE | OROMUCOSAL | Status: DC | PRN

## 2020-05-08 MED ORDER — BISACODYL 10 MG RE SUPP: 10.0000 mg | Freq: Two times a day (BID) | RECTAL | Status: DC | PRN

## 2020-05-08 MED ORDER — PROCHLORPERAZINE EDISYLATE 10 MG/2ML IJ SOLN: 5.0000 mg | INTRAMUSCULAR | Status: DC | PRN

## 2020-05-08 MED ORDER — DIPHENHYDRAMINE HCL 50 MG/ML IJ SOLN: 12.5000 mg | Freq: Four times a day (QID) | INTRAMUSCULAR | Status: DC | PRN

## 2020-05-08 MED ORDER — ALBUTEROL SULFATE (2.5 MG/3ML) 0.083% IN NEBU: 2.5000 mg | INHALATION_SOLUTION | Freq: Four times a day (QID) | RESPIRATORY_TRACT | Status: DC | PRN

## 2020-05-08 MED ORDER — SODIUM CHLORIDE 0.9% FLUSH
3.0000 mL | Freq: Two times a day (BID) | INTRAVENOUS | Status: DC
  Administered 2020-05-08: 3 mL via INTRAVENOUS

## 2020-05-08 MED ORDER — SODIUM CHLORIDE 0.9 % IV SOLN: 250.0000 mL | INTRAVENOUS | Status: DC | PRN

## 2020-05-08 MED ORDER — DIATRIZOATE MEGLUMINE & SODIUM 66-10 % PO SOLN
ORAL | Status: AC
  Filled 2020-05-08: qty 30

## 2020-05-08 MED ORDER — LACTATED RINGERS IV BOLUS: 1000.0000 mL | Freq: Three times a day (TID) | INTRAVENOUS | Status: DC | PRN

## 2020-05-08 MED ORDER — PANTOPRAZOLE SODIUM 40 MG PO TBEC
40.0000 mg | DELAYED_RELEASE_TABLET | Freq: Two times a day (BID) | ORAL | Status: DC
  Administered 2020-05-09: 40 mg via ORAL
  Filled 2020-05-08: qty 1

## 2020-05-08 MED ORDER — OXYCODONE HCL 5 MG PO TABS: 5.0000 mg | ORAL_TABLET | ORAL | Status: DC | PRN

## 2020-05-08 MED ORDER — METHOCARBAMOL 1000 MG/10ML IJ SOLN: 1000.0000 mg | Freq: Four times a day (QID) | INTRAVENOUS | Status: DC | PRN

## 2020-05-08 MED ORDER — POLYETHYLENE GLYCOL 3350 17 G PO PACK
17.0000 g | PACK | Freq: Every day | ORAL | Status: DC
  Filled 2020-05-08: qty 1

## 2020-05-08 MED ORDER — MAGIC MOUTHWASH
15.0000 mL | Freq: Four times a day (QID) | ORAL | Status: DC | PRN
  Filled 2020-05-08: qty 15

## 2020-05-08 MED ORDER — ACETAMINOPHEN 500 MG PO TABS
1000.0000 mg | ORAL_TABLET | Freq: Four times a day (QID) | ORAL | Status: DC
  Administered 2020-05-08: 1000 mg via ORAL
  Filled 2020-05-08 (×3): qty 2

## 2020-05-08 MED ORDER — ALUM & MAG HYDROXIDE-SIMETH 200-200-20 MG/5ML PO SUSP: 30.0000 mL | Freq: Four times a day (QID) | ORAL | Status: DC | PRN

## 2020-05-08 MED ORDER — LIP MEDEX EX OINT
1.0000 "application " | TOPICAL_OINTMENT | Freq: Two times a day (BID) | CUTANEOUS | Status: DC
  Administered 2020-05-08 – 2020-05-09 (×2): 1 via TOPICAL

## 2020-05-08 NOTE — Progress Notes (Signed)
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  Because of the residulal edema of the tissue we reduced the hernia, repaired the hiatus posteriorly and performed a gastropexy.  I also debulked the proximal stomach where the large sac was attached.  At least 3 cm of esophagus was in the abdomen at the completion.  I elected not to wrap because she had no antecedent reflux to speak of but more importantly she had a lot of tissue edema making accurate foregut reconstruction less precise.  Assessment/Plan History of hypertension Hx renal artery stenosis Elevated LFTs 3.8 cm cystic mass  POD 1, s/p reduction of hiatal hernia and closure of hiatus and gastropexy by Dr. Daphine Deutscher, 05/06/20 for Gastric volvulus with outlet obstruction EGD with decompression of a gastric volvulus/NG tube placement 05/04/2020, Dr.Kavitha V. Nandigam  Esophagram to assess anatomy and  edema.  If no evidence of any esophagogastric obstruction, advance to pured diet.  If tight in that region, diuresis and IV steroids and sips.  Readvance diet later in a few days.  FEN:  Stop IV fluids with as needed backup to avoid fluid overload.  Lasix x1. ID: None DVT: SCD, loveno     FEN: sips of clears/IV fluid ID: None DVT: SCD, lovenox HTN control  Disposition:  Disposition:  The patient is from: Home  Anticipate discharge to:  Home  Anticipated Date of Discharge is:  March 14,2022    Barriers to discharge:  Pending Clinical improvement (more likely than not)  Patient currently is NOT MEDICALLY STABLE for discharge from the hospital from a surgery standpoint.      25 minutes spent in review, evaluation, examination, counseling, and coordination of care.   I have reviewed this patient's available data, including medical history, events of note, physical examination and test results as part of my evaluation.  A significant portion of that time was spent in counseling.  Care during the described time interval was provided by me.  05/08/2020    Subjective: (Chief complaint)  Some pain mostly controlled.  Having some dysphagia even with sips.  Belching a lot.  Had bowel movement no flatus.  Walking hallways.  Objective:  Vital signs:  Vitals:   05/07/20 1355 05/07/20 1750 05/07/20 2056 05/08/20 0541  BP: (!) 151/77 (!) 144/75 (!) 148/77 (!) 158/84  Pulse: 75 71 80 88  Resp: 17 16 16 17   Temp: 98.1 F (36.7 C) 98.4 F (36.9 C) 98.3 F (36.8 C) 99.6 F (37.6 C)  TempSrc: Oral Oral Oral Oral  SpO2: 98% 97% 97% 94%  Weight:      Height:        Last BM Date: 05/07/20  Intake/Output   Yesterday:  03/11 0701 - 03/12 0700 In: 1925 [P.O.:300; I.V.:1425; IV Piggyback:200] Out: 1450 [Urine:1450] This shift:  No intake/output data recorded.  Bowel function:  Flatus: No  BM:  YES  Drain: (No drain)   Physical Exam:  General: Pt  awake/alert in no acute distress Eyes: PERRL, normal EOM.  Sclera clear.  No icterus Neuro: CN II-XII intact w/o focal sensory/motor deficits. Lymph: No head/neck/groin lymphadenopathy Psych:  No delerium/psychosis/paranoia.  Oriented x 4 HENT: Normocephalic, Mucus membranes moist.  No thrush Neck: Supple, No tracheal deviation.  No obvious thyromegaly Chest: No pain to chest wall compression.  Good respiratory excursion.  No audible wheezing CV:  Pulses intact.  Regular rhythm.  No major extremity edema MS: Normal AROM mjr joints.  No obvious deformity  Abdomen: Soft.  Mildy distended.  Mildly tender at incisions only.  No evidence of peritonitis.  No incarcerated hernias.  Ext:  No deformity.  No mjr edema.  No cyanosis Skin: No petechiae / purpurea.  No major sores.  Warm and dry    Results:   Cultures: Recent Results (from the past 720 hour(s))  Resp Panel by RT-PCR (Flu A&B, Covid) Nasopharyngeal Swab     Status: None   Collection Time: 05/03/20  7:42 PM   Specimen: Nasopharyngeal Swab; Nasopharyngeal(NP) swabs in vial transport medium  Result Value Ref Range Status   SARS Coronavirus 2 by RT PCR NEGATIVE NEGATIVE Final    Comment: (NOTE) SARS-CoV-2 target nucleic acids are NOT DETECTED.  The SARS-CoV-2 RNA is generally detectable in upper respiratory specimens during the acute phase of infection. The lowest concentration of SARS-CoV-2 viral copies this assay can detect is 138 copies/mL. A negative result does not preclude SARS-Cov-2 infection and should not be used as the sole basis for treatment or other patient management decisions. A negative result may occur with  improper specimen collection/handling, submission of specimen other than nasopharyngeal swab, presence of viral mutation(s) within the areas targeted by this assay, and inadequate number of viral copies(<138 copies/mL). A negative result must be combined with clinical observations, patient history, and  epidemiological information. The expected result is Negative.  Fact Sheet for Patients:  07/03/20  Fact Sheet for Healthcare Providers:  BloggerCourse.com  This test is no t yet approved or cleared by the [place] FDA and  has been authorized for detection and/or diagnosis of SARS-CoV-2 by FDA under an Emergency Use Authorization (EUA). This EUA will remain  in effect (meaning this test can be used) for the duration of the COVID-19 declaration under Section 564(b)(1) of the Act, 21 U.S.C.section 360bbb-3(b)(1), unless the authorization is terminated  or revoked sooner.       Influenza A by PCR NEGATIVE NEGATIVE Final   Influenza B by PCR NEGATIVE NEGATIVE Final    Comment: (NOTE) The Xpert Xpress SARS-CoV-2/FLU/RSV plus assay is intended as an aid in the diagnosis of influenza from Nasopharyngeal swab specimens and should not be used as a sole basis for treatment. Nasal washings and aspirates are unacceptable for Xpert Xpress SARS-CoV-2/FLU/RSV testing.  Fact Sheet  for Patients: BloggerCourse.comhttps://www.fda.gov/media/152166/download  Fact Sheet for Healthcare Providers: SeriousBroker.ithttps://www.fda.gov/media/152162/download  This test is not yet approved or cleared by the [place] FDA and has been authorized for detection and/or diagnosis of SARS-CoV-2 by FDA under an Emergency Use Authorization (EUA). This EUA will remain in effect (meaning this test can be used) for the duration of the COVID-19 declaration under Section 564(b)(1) of the Act, 21 U.S.C. section 360bbb-3(b)(1), unless the authorization is terminated or revoked.  Performed at [facility] [place], [address] [place], [place] [address]   MRSA PCR Screening     Status: None   Collection Time: 05/05/20 10:59 PM   Specimen: Nasal Mucosa; Nasopharyngeal  Result Value Ref Range Status   MRSA by PCR NEGATIVE NEGATIVE Final    Comment:        The GeneXpert MRSA  Assay (FDA approved for NASAL specimens only), is one component of a comprehensive MRSA colonization surveillance program. It is not intended to diagnose MRSA infection nor to guide or monitor treatment for MRSA infections. Performed at [facility], [address] [place], [place] [address]     Labs: Results for orders placed or performed during the hospital encounter of 05/03/20 (from the past 48 hour(s))  Basic metabolic panel     Status: Abnormal   Collection Time: 05/07/20  5:25 AM  Result Value Ref Range   Sodium 136 135 - 145 mmol/L   Potassium 3.9 3.5 - 5.1 mmol/L   Chloride 104 98 - 111 mmol/L   CO2 21 (L) 22 - 32 mmol/L   Glucose, Bld 115 (H) 70 - 99 mg/dL    Comment: Glucose reference range applies only to samples taken after fasting for at least 8 hours.   BUN 12 8 - 23 mg/dL   Creatinine, Ser 0.981.00 0.44 - 1.00 mg/dL   Calcium 8.6 (L) 8.9 - 10.3 mg/dL   GFR, Estimated >11>60 >91>60 mL/min    Comment: (NOTE) Calculated using the CKD-EPI Creatinine Equation (2021)    Anion gap 11 5 - 15    Comment: Performed at [facility], [address] [place], [place] [address]  CBC with Differential/Platelet     Status: Abnormal   Collection Time: 05/07/20  5:25 AM  Result Value Ref Range   WBC 8.2 4.0 - 10.5 K/uL   RBC 3.88 3.87 - 5.11 MIL/uL   Hemoglobin 11.5 (L) 12.0 - 15.0 g/dL   HCT 56.234.6 (L) 13.036.0 - 86.546.0 %   MCV 89.2 80.0 - 100.0 fL   MCH 29.6 26.0 - 34.0 pg   MCHC 33.2 30.0 - 36.0 g/dL   RDW 78.412.3 69.611.5 - 29.515.5 %   Platelets 199 150 - 400 K/uL   nRBC 0.0 0.0 - 0.2 %   Neutrophils Relative % 74 %   Neutro Abs 6.1 1.7 - 7.7 K/uL   Lymphocytes Relative 13 %   Lymphs Abs 1.1 0.7 - 4.0 K/uL   Monocytes Relative 12 %   Monocytes Absolute 1.0 0.1 - 1.0 K/uL   Eosinophils Relative 0 %   Eosinophils Absolute 0.0 0.0 - 0.5 K/uL   Basophils Relative 0 %   Basophils Absolute 0.0 0.0 - 0.1 K/uL   Immature Granulocytes 1 %   Abs Immature  Granulocytes 0.04 0.00 - 0.07 K/uL    Comment: Performed at [facility], [address] [place], [place] [address]    Imaging / Studies: No results found.  Medications / Allergies: per  chart  Antibiotics: Anti-infectives (From admission, onward)   Start     Dose/Rate Route Frequency Ordered Stop   05/06/20 0600  ceFAZolin (ANCEF) IVPB 2g/100 mL premix        2 g 200 mL/hr over 30 Minutes Intravenous On call to O.R. 05/05/20 1508 05/06/20 0940        Note: Portions of this report may have been transcribed using voice recognition software. Every effort was made to ensure accuracy; however, inadvertent computerized transcription errors may be present.   Any transcriptional errors that result from this process are unintentional.    Ardeth Sportsman, MD, FACS, MASCRS  Gastrointestinal and Minimally Invasive Surgery  [facility] [address], [address] [place], [place] [address] [phone] Fax [phone] Main/Paging  CONTACT INFORMATION: Weekday (9AM-5PM) concerns: Call [facility] main office at [phone] Weeknight (5PM-9AM) or Weekend/Holiday concerns: Check [email or web address] for General Surgery [facility] coverage (Please, do not use SecureChat as it is not reliable communication to operating surgeons for immediate patient care)      05/08/2020  10:01 AM

## 2020-05-09 MED ORDER — OXYCODONE HCL 5 MG PO TABS: 5.0000 mg | ORAL_TABLET | Freq: Four times a day (QID) | ORAL | 0 refills | Status: DC | PRN

## 2020-05-09 MED ORDER — ONDANSETRON HCL 4 MG PO TABS: 4.0000 mg | ORAL_TABLET | Freq: Three times a day (TID) | ORAL | 5 refills | Status: DC | PRN

## 2020-05-09 NOTE — Discharge Instructions (Signed)
Hiatal Hernia  A hiatal hernia occurs when part of the stomach slides above the muscle that separates the abdomen from the chest (diaphragm). A person can be born with a hiatal hernia (congenital), or it may develop over time. In almost all cases of hiatal hernia, only the top part of the stomach pushes through the diaphragm. Many people have a hiatal hernia with no symptoms. The larger the hernia, the more likely it is that you will have symptoms. In some cases, a hiatal hernia allows stomach acid to flow back into the tube that carries food from your mouth to your stomach (esophagus). This may cause heartburn symptoms. Severe heartburn symptoms may mean that you have developed a condition called gastroesophageal reflux disease (GERD). What are the causes? This condition is caused by a weakness in the opening (hiatus) where the esophagus passes through the diaphragm to attach to the upper part of the stomach. A person may be born with a weakness in the hiatus, or a weakness can develop over time. What increases the risk? This condition is more likely to develop in:  Older people. Age is a major risk factor for a hiatal hernia, especially if you are over the age of 72.  Pregnant women.  People who are overweight.  People who have frequent constipation. What are the signs or symptoms? Symptoms of this condition usually develop in the form of GERD symptoms. Symptoms include:  Heartburn.  Belching.  Indigestion.  Trouble swallowing.  Coughing or wheezing.  Sore throat.  Hoarseness.  Chest pain.  Nausea and vomiting. How is this diagnosed? This condition may be diagnosed during testing for GERD. Tests that may be done include:  X-rays of your stomach or chest.  An upper gastrointestinal (GI) series. This is an X-ray exam of your GI tract that is taken after you swallow a chalky liquid that shows up clearly on the X-ray.  Endoscopy. This is a procedure to look into your stomach  using a thin, flexible tube that has a tiny camera and light on the end of it. How is this treated? This condition may be treated by: ?   Surgery to repair the hernia, if other treatments are not helping.    EATING AFTER YOUR ESOPHAGEAL SURGERY (Stomach Fundoplication, Hiatal Hernia repair, Achalasia surgery, etc)  ######################################################################  EAT Start with a pureed / full liquid diet (see below) Gradually transition to a high fiber diet with a fiber supplement over the next month after discharge.    WALK Walk an hour a day.  Control your pain to do that.    CONTROL PAIN Control pain so that you can walk, sleep, tolerate sneezing/coughing, go up/down stairs.  HAVE A BOWEL MOVEMENT DAILY Keep your bowels regular to avoid problems.  OK to try a laxative to override constipation.  OK to use an antidairrheal to slow down diarrhea.  Call if not better after 2 tries  CALL IF YOU HAVE PROBLEMS/CONCERNS Call if you are still struggling despite following these instructions. Call if you have concerns not answered by these instructions  ######################################################################   After your esophageal surgery, expect some sticking with swallowing over the next 1-2 months.    If food sticks when you eat, it is called "dysphagia".  This is due to swelling around your esophagus at the wrap & hiatal diaphragm repair.  It will gradually ease off over the next few months.  To help you through this temporary phase, we start you out on a pureed (blenderized)  diet.  Your first meal in the hospital was thin liquids.  You should have been given a pureed diet by the time you left the hospital.  We ask patients to stay on a pureed diet for the first 2-3 weeks to avoid anything getting "stuck" near your recent surgery.  Don't be alarmed if your ability to swallow doesn't progress according to this plan.  Everyone is different and  some diets can advance more or less quickly.    It is often helpful to crush your medications or split them as they can sometimes stick, especially the first week or so.   Some BASIC RULES to follow are:  Maintain an upright position whenever eating or drinking.  Take small bites - just a teaspoon size bite at a time.  Eat slowly.  It may also help to eat only one food at a time.  Consider nibbling through smaller, more frequent meals & avoid the urge to eat BIG meals  Do not push through feelings of fullness, nausea, or bloatedness  Do not mix solid foods and liquids in the same mouthful  Try not to "wash foods down" with large gulps of liquids.  Avoid carbonated (bubbly/fizzy) drinks.    Avoid foods that make you feel gassy or bloated.  Start with bland foods first.  Wait on trying greasy, fried, or spicy meals until you are tolerating more bland solids well.  Understand that it will be hard to burp and belch at first.  This gradually improves with time.  Expect to be more gassy/flatulent/bloated initially.  Walking will help your body manage it better.  Consider using medications for bloating that contain simethicone such as  Maalox or Gas-X   Consider crushing her medications, especially smaller pills.  The ability to swallow pills should get easier after a few weeks  Eat in a relaxed atmosphere & minimize distractions.  Avoid talking while eating.    Do not use straws.  Following each meal, sit in an upright position (90 degree angle) for 60 to 90 minutes.  Going for a short walk can help as well  If food does stick, don't panic.  Try to relax and let the food pass on its own.  Sipping WARM LIQUID such as strong hot black tea can also help slide it down.   Be gradual in changes & use common sense:  -If you easily tolerating a certain "level" of foods, advance to the next level gradually -If you are having trouble swallowing a particular food, then avoid it.   -If  food is sticking when you advance your diet, go back to thinner previous diet (the lower LEVEL) for 1-2 days.  LEVEL 1 = PUREED DIET  Do for the first 2 WEEKS AFTER SURGERY  -Foods in this group are pureed or blenderized to a smooth, mashed potato-like consistency.  -If necessary, the pureed foods can keep their shape with the addition of a thickening agent.   -Meat should be pureed to a smooth, pasty consistency.  Hot broth or gravy may be added to the pureed meat, approximately 1 oz. of liquid per 3 oz. serving of meat. -CAUTION:  If any foods do not puree into a smooth consistency, swallowing will be more difficult.  (For example, nuts or seeds sometimes do not blend well.)  Hot Foods Cold Foods  Pureed scrambled eggs and cheese Pureed cottage cheese  Baby cereals Thickened juices and nectars  Thinned cooked cereals (no lumps) Thickened milk or eggnog  Pureed [place] toast or pancakes Ensure  Mashed potatoes Ice cream  Pureed parsley, au gratin, scalloped potatoes, candied sweet potatoes Fruit or [place] ice, sherbet  Pureed buttered or alfredo noodles Plain yogurt  Pureed vegetables (no corn or peas) Instant breakfast  Pureed soups and creamed soups Smooth pudding, mousse, custard  Pureed scalloped apples Whipped gelatin  Gravies Sugar, syrup, honey, jelly  Sauces, cheese, tomato, barbecue, white, creamed Cream  Any baby food Creamer  Alcohol in moderation (not beer or champagne) Margarine  Coffee or tea Mayonnaise   Ketchup, mustard   Apple sauce   SAMPLE MENU:  PUREED DIET Breakfast Lunch Dinner   Orange juice, 1/2 cup  Cream of wheat, 1/2 cup  Pineapple juice, 1/2 cup  Pureed [place], barley soup, 3/4 cup  Pureed Hawaiian chicken, 3 oz   Scrambled eggs, mashed or blended with cheese, 1/2 cup  Tea or coffee, 1 cup   Whole milk, 1 cup   Non-dairy creamer, 2 Tbsp.  Mashed potatoes, 1/2 cup  Pureed cooled broccoli, 1/2 cup  Apple sauce, 1/2 cup  Coffee or tea   Mashed potatoes, 1/2 cup  Pureed spinach, 1/2 cup  Frozen yogurt, 1/2 cup  Tea or coffee      LEVEL 2 = SOFT DIET  After your first 2 weeks, you can advance to a soft diet.   Keep on this diet until everything goes down easily.  Hot Foods Cold Foods  White fish Cottage cheese  Stuffed fish Junior baby fruit  Baby food meals Semi thickened juices  Minced soft cooked, scrambled, poached eggs nectars  Souffle & omelets Ripe mashed bananas  Cooked cereals Canned fruit, pineapple sauce, milk  potatoes Milkshake  Buttered or Alfredo noodles Custard  Cooked cooled vegetable Puddings, including tapioca  Sherbet Yogurt  Vegetable soup or alphabet soup Fruit ice, [place] ice  Gravies Whipped gelatin  Sugar, syrup, honey, jelly Junior baby desserts  Sauces:  Cheese, creamed, barbecue, tomato, white Cream  Coffee or tea Margarine   SAMPLE MENU:  LEVEL 2 Breakfast Lunch Dinner   Orange juice, 1/2 cup  Oatmeal, 1/2 cup  Scrambled eggs with cheese, 1/2 cup  Decaffeinated tea, 1 cup  Whole milk, 1 cup  Non-dairy creamer, 2 Tbsp  Pineapple juice, 1/2 cup  Minced beef, 3 oz  Gravy, 2 Tbsp  Mashed potatoes, 1/2 cup  Minced fresh broccoli, 1/2 cup  Applesauce, 1/2 cup  Coffee, 1 cup  [place], barley soup, 3/4 cup  Minced Hawaiian chicken, 3 oz  Mashed potatoes, 1/2 cup  Cooked spinach, 1/2 cup  Frozen yogurt, 1/2 cup  Non-dairy creamer, 2 Tbsp      LEVEL 3 = CHOPPED DIET  -After all the foods in level 2 (soft diet) are passing through well you should advance up to more chopped foods.  -It is still important to cut these foods into small pieces and eat slowly.  Hot Foods Cold Foods  Poultry Cottage cheese  Chopped Swedish meatballs Yogurt  Meat salads (ground or flaked meat) Milk  Flaked fish (tuna) Milkshakes  Poached or scrambled eggs Soft, cold, dry cereal  Souffles and omelets Fruit juices or nectars  Cooked cereals Chopped canned fruit  Chopped  [place] toast or pancakes Canned fruit cocktail  Noodles or pasta (no rice) Pudding, mousse, custard  Cooked vegetables (no frozen peas, corn, or mixed vegetables) Green salad  Canned small sweet peas Ice cream  Creamed soup or vegetable soup Fruit ice, [place] ice  Pureed vegetable soup  or alphabet soup Non-dairy creamer  Ground scalloped apples Margarine  Gravies Mayonnaise  Sauces:  Cheese, creamed, barbecue, tomato, white Ketchup  Coffee or tea Mustard   SAMPLE MENU:  LEVEL 3 Breakfast Lunch Dinner   Orange juice, 1/2 cup  Oatmeal, 1/2 cup  Scrambled eggs with cheese, 1/2 cup  Decaffeinated tea, 1 cup  Whole milk, 1 cup  Non-dairy creamer, 2 Tbsp  Ketchup, 1 Tbsp  Margarine, 1 tsp  Salt, 1/4 tsp  Sugar, 2 tsp  Pineapple juice, 1/2 cup  Ground beef, 3 oz  Gravy, 2 Tbsp  Mashed potatoes, 1/2 cup  Cooked spinach, 1/2 cup  Applesauce, 1/2 cup  Decaffeinated coffee  Whole milk  Non-dairy creamer, 2 Tbsp  Margarine, 1 tsp  Salt, 1/4 tsp  Pureed [place], barley soup, 3/4 cup  Barbecue chicken, 3 oz  Mashed potatoes, 1/2 cup  Ground fresh broccoli, 1/2 cup  Frozen yogurt, 1/2 cup  Decaffeinated tea, 1 cup  Non-dairy creamer, 2 Tbsp  Margarine, 1 tsp  Salt, 1/4 tsp  Sugar, 1 tsp    LEVEL 4:  REGULAR FOODS  -Foods in this group are soft, moist, regularly textured foods.   -This level includes meat and breads, which tend to be the hardest things to swallow.   -Eat very slowly, chew well and continue to avoid carbonated drinks. -most people are at this level in 4-6 weeks  Hot Foods Cold Foods  Baked fish or skinned Soft cheeses - cottage cheese  Souffles and omelets Cream cheese  Eggs Yogurt  Stuffed shells Milk  Spaghetti with meat sauce Milkshakes  Cooked cereal Cold dry cereals (no nuts, dried fruit, coconut)  [place] toast or pancakes Crackers  Buttered toast Fruit juices or nectars  Noodles or pasta (no rice) Canned fruit  Potatoes  (all types) Ripe bananas  Soft, cooked vegetables (no corn, lima, or baked beans) Peeled, ripe, fresh fruit  Creamed soups or vegetable soup Cakes (no nuts, dried fruit, coconut)  Canned chicken noodle soup Plain doughnuts  Gravies Ice cream  Bacon dressing Pudding, mousse, custard  Sauces:  Cheese, creamed, barbecue, tomato, white Fruit ice, [place] ice, sherbet  Decaffeinated tea or coffee Whipped gelatin  Pork chops Regular gelatin   Canned fruited gelatin molds   Sugar, syrup, honey, jam, jelly   Cream   Non-dairy   Margarine   Oil   Mayonnaise   Ketchup   Mustard   TROUBLESHOOTING IRREGULAR BOWELS  1) Avoid extremes of bowel movements (no bad constipation/diarrhea)  2) Miralax 17gm mixed in 8oz. water or juice-daily. May use BID as needed.  3) Gas-x,Phazyme, etc. as needed for gas & bloating.  4) Soft,bland diet. No spicy,greasy,fried foods.  5) Prilosec over-the-counter as needed  6) May hold gluten/wheat products from diet to see if symptoms improve.  7) May try probiotics (Align, Activa, etc) to help calm the bowels down  7) If symptoms become worse call back immediately.    If you have any questions please call our office at [facility]: [phone].   LAPAROSCOPIC SURGERY: POST OP INSTRUCTIONS  ######################################################################  EAT Gradually transition to a high fiber diet with a fiber supplement over the next few weeks after discharge.  Start with a pureed / full liquid diet (see below)  WALK Walk an hour a day.  Control your pain to do that.    CONTROL PAIN Control pain so that you can walk, sleep, tolerate sneezing/coughing, go up/down stairs.  HAVE A BOWEL MOVEMENT DAILY Keep  your bowels regular to avoid problems.  OK to try a laxative to override constipation.  OK to use an antidairrheal to slow down diarrhea.  Call if not better after 2 tries  CALL IF YOU HAVE PROBLEMS/CONCERNS Call if you are still  struggling despite following these instructions. Call if you have concerns not answered by these instructions  ######################################################################    1. DIET: Follow a light bland diet & liquids the first 24 hours after arrival home, such as soup, liquids, starches, etc.  Be sure to drink plenty of fluids.  Quickly advance to a usual solid diet within a few days.  Avoid fast food or heavy meals as your are more likely to get nauseated or have irregular bowels.  A low-fat, high-fiber diet for the rest of your life is ideal.  2. Take your usually prescribed home medications unless otherwise directed.  3. PAIN CONTROL: a. Pain is best controlled by a usual combination of three different methods TOGETHER: i. Ice/Heat ii. Over the counter pain medication iii. Prescription pain medication b. Most patients will experience some swelling and bruising around the incisions.  Ice packs or heating pads (30-60 minutes up to 6 times a day) will help. Use ice for the first few days to help decrease swelling and bruising, then switch to heat to help relax tight/sore spots and speed recovery.  Some people prefer to use ice alone, heat alone, alternating between ice & heat.  Experiment to what works for you.  Swelling and bruising can take several weeks to resolve.   c. It is helpful to take an over-the-counter pain medication regularly for the first few weeks.  Choose one of the following that works best for you: i. Naproxen (Aleve, etc)  Two  tabs twice a day ii. Ibuprofen (Advil, etc) Three  tabs four times a day (every meal & bedtime) iii. Acetaminophen (Tylenol, etc) 500-650mg  four times a day (every meal & bedtime) d. A  prescription for pain medication (such as oxycodone, hydrocodone, tramadol, gabapentin, methocarbamol, etc) should be given to you upon discharge.  Take your pain medication as prescribed.  i. If you are having problems/concerns with the  prescription medicine (does not control pain, nausea, vomiting, rash, itching, etc), please call us [phone] to see if we need to switch you to a different pain medicine that will work better for you and/or control your side effect better. ii. If you need a refill on your pain medication, please give [place] 48 hour notice.  contact your pharmacy.  They will contact our office to request authorization. Prescriptions will not be filled after 5 pm or on week-ends  4. Avoid getting constipated.   a. Between the surgery and the pain medications, it is common to experience some constipation.   b. Increasing fluid intake and taking a fiber supplement (such as Metamucil, Citrucel, FiberCon, MiraLax, etc) 1-2 times a day regularly will usually help prevent this problem from occurring.   c. A mild laxative (prune juice, Milk of Magnesia, MiraLax, etc) should be taken according to package directions if there are no bowel movements after 48 hours.   5. Watch out for diarrhea.   a. If you have many loose bowel movements, simplify your diet to bland foods & liquids for a few days.   b. Stop any stool softeners and decrease your fiber supplement.   c. Switching to mild anti-diarrheal medications (Kayopectate, Pepto Bismol) can help.   d. If this worsens or does not improve, please  call us.  6. Wash / shower every day.  You may shower over the dressings as they are waterproof.  Continue to shower over incision(s) after the dressing is off.  7. Remove your waterproof bandages 3 days after surgery.  You may leave the incision open to air.  You may replace a dressing/Band-Aid to cover the incision for comfort if you wish.   8. ACTIVITIES as tolerated:   i. You may resume regular (light) daily activities beginning the next day--such as daily self-care, walking, climbing stairs--gradually increasing activities as tolerated.  If you can walk 30 minutes without difficulty, it is safe to try more intense activity such  as jogging, treadmill, bicycling, low-impact aerobics, swimming, etc. ii. Save the most intensive and strenuous activity for last such as sit-ups, heavy lifting, contact sports, etc  Refrain from any heavy lifting or straining until you are off narcotics for pain control.   iii. DO NOT PUSH THROUGH PAIN.  Let pain be your guide: If it hurts to do something, don't do it.  Pain is your body warning you to avoid that activity for another week until the pain goes down. iv. You may drive when you are no longer taking prescription pain medication, you can comfortably wear a seatbelt, and you can safely maneuver your car and apply brakes. v. You may have sexual intercourse when it is comfortable.  9. FOLLOW UP in our office a. Please call [facility] at [phone] to set up an appointment to see your surgeon in the office for a follow-up appointment approximately 2-3 weeks after your surgery. b. Make sure that you call for this appointment the day you arrive home to insure a convenient appointment time.  10. IF YOU HAVE DISABILITY OR FAMILY LEAVE FORMS, BRING THEM TO THE OFFICE FOR PROCESSING.  DO NOT GIVE THEM TO YOUR DOCTOR.   WHEN TO CALL us [phone]: 1. Poor pain control 2. Reactions / problems with new medications (rash/itching, nausea, etc)  3. Fever over 101.5 F (38.5 C) 4. Inability to urinate 5. Nausea and/or vomiting 6. Worsening swelling or bruising 7. Continued bleeding from incision. 8. Increased pain, redness, or drainage from the incision   The clinic staff is available to answer your questions during regular business hours (8:30am-5pm).  Please don't hesitate to call and ask to speak to one of our nurses for clinical concerns.   If you have a medical emergency, go to the nearest emergency room or call 911.  A surgeon from [facility] is always on call at the [facility], [place] [address], [address], [place], [place]  [address]  ? MAIN: [phone] ? TOLL FREE: [phone] ?  FAX [phone] [email or web address]

## 2020-05-09 NOTE — Progress Notes (Signed)
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  Because of the residulal edema of the tissue we reduced the hernia, repaired the hiatus posteriorly and performed a gastropexy.  I also debulked the proximal stomach where the large sac was attached.  At least 3 cm of esophagus was in the abdomen at the completion.  I elected not to wrap because she had no antecedent reflux to speak of but more importantly she had a lot of tissue edema making accurate foregut reconstruction less precise.  Assessment/Plan History of hypertension Hx renal artery stenosis Elevated LFTs 3.8 cm cystic mass  POD 1, s/p reduction of hiatal hernia and closure of hiatus and gastropexy by Dr. Daphine Deutscher, 05/06/20 for Gastric volvulus with outlet obstruction EGD with decompression of a gastric volvulus/NG tube  placement 05/04/2020, Dr.Kavitha V. Nandigam  Esophagram with no evidence of obstruction.  Patient tolerated a few bites of dysphagia 1 diet last night.  Feeling better this morning.  See if she can get adequate calorie count intake.  Teach pured diet as she will need that for the first several weeks given significant edema from her hiatal hernia.  Possibly DC later today if meets goals.  FEN:  Stop IV fluids with as needed backup to avoid fluid overload.  Lasix x1. ID: None DVT: SCD, loveno     FEN: sips of clears/IV fluid ID: None DVT: SCD, lovenox HTN control  Disposition:  Disposition:  The patient is from: Home  Anticipate discharge to:  Home  Anticipated Date of Discharge is:  March 14,2022    Barriers to discharge:  Pending Clinical improvement (more likely than not)  Patient currently is NOT MEDICALLY STABLE for discharge from the hospital from a surgery standpoint.      25 minutes spent in review, evaluation, examination, counseling, and coordination of care.   I have reviewed this patient's available data, including medical history, events of note, physical examination and test results as part of my evaluation.  A significant portion of that time was spent in counseling.  Care during the described time interval was provided by me.  05/09/2020    Subjective: (Chief complaint)  Esophagram with no obstruction.  Advanced to  pured/dysphagia 1 diet last night.  Some dysphagia but she feels better this morning.  Hoping to go home later today.  Denies much pain or nausea.  Objective:  Vital signs:  Vitals:   05/08/20 0541 05/08/20 1345 05/08/20 2104 05/09/20 0623  BP: (!) 158/84 (!) 154/89 138/84 133/81  Pulse: 88 83 87 76  Resp: 17 17 18 18   Temp: 99.6 F (37.6 C) 98.7 F (37.1 C) 99.3 F (37.4 C) 99 F (37.2 C)  TempSrc: Oral Oral Oral Oral  SpO2: 94% 97% 95% 95%  Weight:      Height:        Last BM Date: 05/07/20  Intake/Output    Yesterday:  03/12 0701 - 03/13 0700 In: 1765 [P.O.:840; I.V.:825; IV Piggyback:100] Out: 1001 [Urine:1000; Stool:1] This shift:  No intake/output data recorded.  Bowel function:  Flatus: YES  BM:  YES  Drain: (No drain)   Physical Exam:  General: Pt awake/alert in no acute distress Eyes: PERRL, normal EOM.  Sclera clear.  No icterus Neuro: CN II-XII intact w/o focal sensory/motor deficits. Lymph: No head/neck/groin lymphadenopathy Psych:  No delerium/psychosis/paranoia.  Oriented x 4 HENT: Normocephalic, Mucus membranes moist.  No thrush Neck: Supple, No tracheal deviation.  No obvious thyromegaly Chest: No pain to chest wall compression.  Good respiratory excursion.  No audible wheezing CV:  Pulses intact.  Regular rhythm.  No major extremity edema MS: Normal AROM mjr joints.  No obvious deformity  Abdomen: Soft.  Nondistended.  Mildly tender at incisions only.  No evidence of peritonitis.  No incarcerated hernias.  Ext:  No deformity.  No mjr edema.  No cyanosis Skin: No petechiae / purpurea.  No major sores.  Warm and dry    Results:   Cultures: Recent Results (from the past 720 hour(s))  Resp Panel by RT-PCR (Flu A&B, Covid) Nasopharyngeal Swab     Status: None   Collection Time: 05/03/20  7:42 PM   Specimen: Nasopharyngeal Swab; Nasopharyngeal(NP) swabs in vial transport medium  Result Value Ref Range Status   SARS Coronavirus 2 by RT PCR NEGATIVE NEGATIVE Final    Comment: (NOTE) SARS-CoV-2 target nucleic acids are NOT DETECTED.  The SARS-CoV-2 RNA is generally detectable in upper respiratory specimens during the acute phase of infection. The lowest concentration of SARS-CoV-2 viral copies this assay can detect is 138 copies/mL. A negative result does not preclude SARS-Cov-2 infection and should not be used as the sole basis for treatment or other patient management decisions. A negative result may occur with  improper specimen collection/handling,  submission of specimen other than nasopharyngeal swab, presence of viral mutation(s) within the areas targeted by this assay, and inadequate number of viral copies(<138 copies/mL). A negative result must be combined with clinical observations, patient history, and epidemiological information. The expected result is Negative.  Fact Sheet for Patients:  07/03/20  Fact Sheet for Healthcare Providers:  BloggerCourse.com  This test is no t yet approved or cleared by the [employer] FDA and  has been authorized for detection and/or diagnosis of SARS-CoV-2 by FDA under an Emergency Use Authorization (EUA). This EUA will remain  in effect (meaning this test can be used) for the duration of the COVID-19 declaration under Section 564(b)(1) of the Act, 21 U.S.C.section 360bbb-3(b)(1), unless the authorization is terminated  or revoked sooner.       Influenza A by PCR NEGATIVE NEGATIVE Final   Influenza B by PCR NEGATIVE NEGATIVE Final    Comment: (NOTE) The Xpert  Xpress SARS-CoV-2/FLU/RSV plus assay is intended as an aid in the diagnosis of influenza from Nasopharyngeal swab specimens and should not be used as a sole basis for treatment. Nasal washings and aspirates are unacceptable for Xpert Xpress SARS-CoV-2/FLU/RSV testing.  Fact Sheet for Patients: BloggerCourse.com  Fact Sheet for Healthcare Providers: [employer]ious[employer]ker[employer]it  This test is not yet approved or cleared by the [place] FDA and has been authorized for detection and/or diagnosis of SARS-CoV-2 by FDA under an Emergency Use Authorization (EUA). This EUA will remain in effect (meaning this test can be used) for the duration of the COVID-19 declaration under Section 564(b)(1) of the Act, 21 U.S.C. section 360bbb-3(b)(1), unless the authorization is terminated or revoked.  Performed at [facility], [address] [place], [place] [address]   MRSA PCR Screening     Status: None   Collection Time: 05/05/20 10:59 PM   Specimen: Nasal Mucosa; Nasopharyngeal  Result Value Ref Range Status   MRSA by PCR NEGATIVE NEGATIVE Final    Comment:        The GeneXpert MRSA Assay (FDA approved for NASAL specimens only), is one component of a comprehensive MRSA colonization surveillance program. It is not intended to diagnose MRSA infection nor to guide or monitor treatment for MRSA infections. Performed at [facility], [address] [place], [place] [address]     Labs: No results found for this or any previous visit (from the past 48 hour(s)).  Imaging / Studies: DG ESOPHAGUS W SINGLE CM (SOL OR THIN BA)  Result Date: 05/08/2020 CLINICAL DATA:  Post Nissen fundoplication. Evaluate for leak and/or obstruction. EXAM: ESOPHOGRAM/BARIUM SWALLOW TECHNIQUE: Single contrast examination was performed using  water-soluble. FLUOROSCOPY TIME:  36 seconds (11.2 mGy) COMPARISON:  Upper GI-05/06/2019 FINDINGS: Ingested enteric contrast passes through the distal esophagus and into the stomach without any significant delay. No discrete areas of contrast extravasation. No reflux identified, though the examination was performed just with the patient in upright positioning. IMPRESSION: Wide patency of the distal esophagus without evidence of obstruction, stasis or leak. Electronically Signed   By: Simonne Come M.D.   On: 05/08/2020 11:33    Medications / Allergies: per chart  Antibiotics: Anti-infectives (From admission, onward)   Start     Dose/Rate Route Frequency Ordered Stop   05/06/20 0600  ceFAZolin (ANCEF) IVPB 2g/100 mL premix        2 g 200 mL/hr over 30 Minutes Intravenous On call to O.R. 05/05/20 1508 05/06/20 0940        Note: Portions of this report may have been transcribed using voice recognition software. Every effort was made to ensure accuracy; however,  inadvertent computerized transcription errors may be present.   Any transcriptional errors that result from this process are unintentional.    Ardeth Sportsman, MD, FACS, MASCRS  Gastrointestinal and Minimally Invasive Surgery  [facility] [address], [address] [place], [place] [address] [phone] Fax [phone] Main/Paging  CONTACT INFORMATION: Weekday (9AM-5PM) concerns: Call [facility] main office at [phone] Weeknight (5PM-9AM) or Weekend/Holiday concerns: Check [email or web address] for General Surgery [facility] coverage (Please, do not use SecureChat as it is not reliable communication to operating surgeons for immediate patient care)      05/09/2020  8:36 AM

## 2020-05-09 NOTE — Progress Notes (Signed)
Reviewed written instructions w pt and all questions answered. Pt verb understanding. Pt d/c via w/c w all belongings, pt in stable condition.

## 2020-05-10 NOTE — Discharge Summary (Signed)
Physician Discharge Summary    Patient ID: Monica Lara MRN: [id number] DOB/AGE: [birth year]  70 y.o.  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 Because of the residulaledema of the tissue we reducedthe hernia, repaired the hiatus posteriorly and performed a gastropexy. I also debulked the proximal stomach where the large sac was attached. At least 3 cm of esophagus was in the abdomen at the completion. I elected not to wrap because she had no antecedent reflux to speak of but more importantly she had a lot of tissueedema making accurate foregut reconstruction less precise.  Consults: GI  Hospital Course:   Patient with worsening nausea and vomiting.  Went to [facility] ER.  Work-up including CAT scan showed incarcerated anal hernia with probable gastric volvulus.  Admitted.  Gastrology consulted for partial endoscopic decompression.  Underwent surgical repair laparoscopically with hiatal hernia crural closure and gastropexy.  Postoperatively, the patient gradually mobilized.  She had some dysphagia but esophagram showed no obstruction.  She gradually advanced to  a blenderized/pured diet per protocol.     By the time of discharge, the patient was walking well the hallways, eating food, having flatus.  Pain was well-controlled on an oral medications.  Based on meeting discharge criteria and continuing to recover, I felt it was safe for the patient to be discharged from the hospital to further recover with close followup. Postoperative recommendations were discussed in detail.  They are written as well.  Discharged Condition: good  Discharge Exam: Blood pressure 131/79, pulse 82, temperature 98.8 F (37.1 C), temperature source Oral, resp. rate 16, height 5' (1.524 m), weight 63.5 kg, SpO2 96 %.  General: Pt awake/alert in no acute distress Eyes: PERRL, normal EOM.  Sclera clear.  No icterus Neuro: CN II-XII intact w/o focal sensory/motor deficits. Lymph: No head/neck/groin lymphadenopathy Psych:  No delerium/psychosis/paranoia.  Oriented x 4 HENT: Normocephalic, Mucus membranes moist.  No thrush Neck: Supple, No tracheal deviation.  No obvious thyromegaly Chest: No pain to chest wall compression.  Good respiratory excursion.  No audible wheezing CV:  Pulses intact.  Regular rhythm.  No major extremity edema MS: Normal AROM mjr joints.  No obvious deformity  Abdomen: Soft.  Nondistended.  Mildly tender at incisions only.  No evidence of peritonitis.  No incarcerated hernias.  Ext:  No deformity.  No mjr edema.  No cyanosis Skin: No petechiae / purpurea.  No major sores.  Warm and dry    Disposition:    Follow-up Information    Luretha Murphy, MD Follow up on 05/28/2020.   Specialty: General Surgery Why: 1:30pm, arrive by 1:00pm for paperwork and check in process Contact information: [address] [place] [address] [phone]               Discharge  disposition: 01-Home or Self Care       Discharge Instructions    Call MD for:   Complete by: As directed    Temperature > 101.33F   Call MD for:  extreme  fatigue   Complete by: As directed    Call MD for:  hives   Complete by: As directed    Call MD for:  persistant nausea and vomiting   Complete by: As directed    Call MD for:  redness, tenderness, or signs of infection (pain, swelling, redness, odor or green/yellow discharge around incision site)   Complete by: As directed    Call MD for:  severe uncontrolled pain   Complete by: As directed    Diet general   Complete by: As directed    SEE ESOPHAGEAL SURGERY DIET INSTRUCTIONS  We using usually start you out on a pureed (blenderized) diet. Expect some sticking with swallowing over the next 1-2 months.   This is due to swelling around your esophagus at the wrap & hiatal diaphragm repair.  It will gradually ease off over the next few months.   Discharge instructions   Complete by: As directed    Please see discharge instruction sheets.   Also refer to any handouts/printouts that may have been given from the [facility] surgery office (if you visited [place] there before surgery) Please call our office if you have any questions or concerns [phone]   Driving Restrictions   Complete by: As directed    No driving until off narcotics and can safely swerve away without pain during an emergency   Increase activity slowly   Complete by: As directed    Lifting restrictions   Complete by: As directed    Avoid heavy lifting initially, <20 pounds at first.   Do not push through pain.   You have no specific weight limit: If it hurts to do, DON'T DO IT.    If you feel no pain, you are not injuring anything.  Pain will protect you from injury.   Coughing and sneezing are far more stressful to your incision than any lifting.   Avoid resuming heavy lifting (>50 pounds) or other intense activity until off all narcotic pain medications.   When want to exercise more, give yourself 2 weeks to gradually get back to full intense exercise/activity.   May shower / Bathe   Complete by: As directed    SHOWER  EVERY DAY.  It is fine for dressings or wounds to be washed/rinsed.  Use gentle soap & water.  This will help the incisions and/or wounds get clean & minimize infection.   May walk up steps   Complete by: As directed    Remove dressing in 72 hours   Complete by: As directed    You have closed incisions: Shower and bathe over these incisions with soap and water every day.  It is OK to wash over the dressings: they are waterproof. Remove all surgical dressings on postoperative day #3.  You do not need to replace dressings over the closed incisions unless you feel more comfortable with a Band-Aid covering it.   DERMABOND:  You have purple skin glue (Dermabond) on your incision(s).  Leave them in place, and they will fall off on their own like a scab in 2-3 weeks.  You may trim any edges that curl up with clean scissors.  Please call our office [phone] if you have further questions.   Sexual Activity Restrictions  Complete by: As directed    Sexual activity as tolerated.  Do not push through pain.  Pain will protect you from injury.   Walk with assistance   Complete by: As directed    Walk over an hour a day.  May use a walker/cane/companion to help with balance and stamina.      Allergies as of 05/09/2020      Reactions   3-methyl-2-benzothiazolinone Hydrazone    Other reaction(s): Other   Ezetimibe Other (See Comments)   Other reaction(s): Myalgias (intolerance)   Sulfa Antibiotics Rash   Statins Other (See Comments)   intolarence      Medication List    TAKE these medications   ondansetron 4 MG tablet Commonly known as: ZOFRAN Take 1 tablet (4 mg total) by mouth every 8 (eight) hours as needed for nausea.   oxyCODONE 5 MG immediate release tablet Commonly known as: Oxy IR/ROXICODONE Take 1-2 tablets (5-10 mg total) by mouth every 6 (six) hours as needed for moderate pain or severe pain.   telmisartan-hydrochlorothiazide 40-12.5 MG tablet Commonly known as: MICARDIS  HCT Take 1 tablet by mouth daily.       Significant Diagnostic Studies:  No results found for this or any previous visit (from the past 72 hour(s)).  DG Chest 2 View  Result Date: 05/03/2020 CLINICAL DATA:  Epigastric pain. EXAM: CHEST - 2 VIEW COMPARISON:  None. FINDINGS: The cardiac silhouette is normal in size. There is a moderate-sized hiatal hernia. There is mild elevation of the right hemidiaphragm. No airspace consolidation, edema, pleural effusion, or pneumothorax is identified. No acute osseous abnormality is seen. IMPRESSION: Hiatal hernia.  No evidence of acute cardiopulmonary disease. Electronically Signed   By: Sebastian Ache M.D.   On: 05/03/2020 16:54   DG Abd 1 View  Result Date: 05/04/2020 CLINICAL DATA:  NG placement. EXAM: ABDOMEN - 1 VIEW COMPARISON:  Earlier film, same date. FINDINGS: The NG tube is coursing down the esophagus and into the stomach. The tip is in the body region of the stomach. The proximal port is near the base of the patient's hiatal hernia. Unremarkable bowel gas pattern. IMPRESSION: The NG tube tip is in the body region of the stomach. Electronically Signed   By: Rudie Meyer M.D.   On: 05/04/2020 12:32   DG Abd 1 View  Result Date: 05/04/2020 CLINICAL DATA:  NG tube placement EXAM: ABDOMEN - 1 VIEW COMPARISON:  None. FINDINGS: Further retraction of NG tube which remains within the hiatal hernia. IMPRESSION: Further retraction of nasogastric tube which remains within the hiatal hernia. Electronically Signed   By: Deatra Robinson M.D.   On: 05/04/2020 02:54   DG Abd 1 View  Result Date: 05/03/2020 CLINICAL DATA:  Nasogastric tube placement. EXAM: ABDOMEN - 1 VIEW COMPARISON:  Radiograph earlier today chest CTA earlier today. FINDINGS: Enteric tube has been retracted and now appears coiled above the diaphragm within hiatal hernia. No small bowel dilatation. Excreted IV contrast within both renal collecting systems and urinary bladder. IMPRESSION: Enteric tube  has been retracted and now appears coiled above the diaphragm within hiatal hernia. Electronically Signed   By: Narda Rutherford M.D.   On: 05/03/2020 23:12   DG Abdomen 1 View  Result Date: 05/03/2020 CLINICAL DATA:  NG tube placed EXAM: ABDOMEN - 1 VIEW COMPARISON:  CT angiography of the chest, abdomen and pelvis 05/03/2020 FINDINGS: Transesophageal tube is seen coiling over a gastric air lucency in the left upper quadrant below the  level of the hemidiaphragm likely beyond the partially obstructive hiatal hernia seen on comparison CT. No other high-grade obstructive bowel gas pattern is seen. Excreted contrast media seen within the collecting system. No other acute osseous or soft tissue abnormality. IMPRESSION: Transesophageal tube coils over the gastric air lucency in the left upper quadrant below the level of the hemidiaphragm likely beyond the partially obstructive hiatal hernia seen on comparison CT. Electronically Signed   By: Kreg Shropshire M.D.   On: 05/03/2020 20:51   CT Angio Chest/Abd/Pel for Dissection W and/or W/WO  Result Date: 05/03/2020 CLINICAL DATA:  Abdominal pain. Concern for aortic dissection. Left upper quadrant pain. Epigastric pain. EXAM: CT ANGIOGRAPHY CHEST, ABDOMEN AND PELVIS TECHNIQUE: Non-contrast CT of the chest was initially obtained. Multidetector CT imaging through the chest, abdomen and pelvis was performed using the standard protocol during bolus administration of intravenous contrast. Multiplanar reconstructed images and MIPs were obtained and reviewed to evaluate the vascular anatomy. CONTRAST:  OMNIPAQUE IOHEXOL 350 MG/ML SOLN COMPARISON:  CT dated 02/03/2009 FINDINGS: CTA CHEST FINDINGS Cardiovascular: There is no evidence for a thoracic aortic dissection. There is no evidence for thoracic aortic aneurysm. There are atherosclerotic changes of the thoracic aorta. The arch vessels are patent where visualized. There are coronary artery calcifications. The heart size is  unremarkable. There is no significant pericardial effusion. There is no large centrally located pulmonary embolism. Detection of smaller pulmonary emboli is limited by study technique. Mediastinum/Nodes: -- No mediastinal lymphadenopathy. -- No hilar lymphadenopathy. -- No axillary lymphadenopathy. -- No supraclavicular lymphadenopathy. -- Normal thyroid gland where visualized. -the esophagus is fluid-filled to the upper thorax. Lungs/Pleura: There is atelectasis at the left lung base. There is no pneumothorax. No large pleural effusion. Musculoskeletal: No chest wall abnormality. No bony spinal canal stenosis. Review of the MIP images confirms the above findings. CTA ABDOMEN AND PELVIS FINDINGS VASCULAR Aorta: Normal caliber aorta without aneurysm, dissection, vasculitis or significant stenosis. Celiac: Patent without evidence of aneurysm, dissection, vasculitis or significant stenosis. SMA: Patent without evidence of aneurysm, dissection, vasculitis or significant stenosis. 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 The right renal artery demonstrates mild stenosis. IMA: Patent without evidence of aneurysm, dissection, vasculitis or significant stenosis. Inflow: Patent without evidence of aneurysm, dissection, vasculitis or significant stenosis. Veins: No obvious venous abnormality within the limitations of this arterial phase study. Review of the MIP images confirms the above findings. NON-VASCULAR Hepatobiliary: There is probable underlying hepatic steatosis. Normal gallbladder.There is no biliary ductal dilation. Pancreas: Normal contours without ductal dilatation. No peripancreatic fluid collection. Spleen: Unremarkable. Adrenals/Urinary Tract: --Adrenal glands: Unremarkable. --Right kidney/ureter: There are multiple cysts involving the right kidney. --Left kidney/ureter: Left kidney is atrophic. There are multiple cysts. --Urinary bladder: Unremarkable. Stomach/Bowel: --Stomach/Duodenum:  There is a large hiatal hernia. The configuration of the stomach is suspicious for a mesenteroaxial gastric volvulus. The GE junction appears to be below the diaphragm while the gastric antrum appears to be above the left hemidiaphragm. This configuration appears to be at least partially obstructing as there is an air-fluid level within the stomach both within the portions of bowel of the diaphragm and below the diaphragm. --Small bowel: Unremarkable. --Colon: Unremarkable. --Appendix: Not visualized. No right lower quadrant inflammation or free fluid. Vascular/Lymphatic: Atherosclerotic calcification is present within the non-aneurysmal abdominal aorta, without hemodynamically significant stenosis. --No retroperitoneal lymphadenopathy. --No mesenteric lymphadenopathy. --No pelvic or inguinal lymphadenopathy. Reproductive: There is a cystic mass in the right hemipelvis measuring approximately 3.8 cm.  Is difficult to determine if this is associated with the right ovary or separate from the right ovary. Uterine fibroids are noted. Other: No ascites or free air. The abdominal wall is normal. Musculoskeletal. No acute displaced fractures. Review of the MIP images confirms the above findings. IMPRESSION: 1. No evidence for aortic dissection. 2. 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 This process appears to be at least partially obstructing. As such, surgical consultation is recommended. 3. Atrophic left kidney. 4. There is a 3.8 cm cystic mass in the right hemipelvis. Is difficult to determine if this is associated with the right ovary or separate from the right ovary. Follow-up with a nonemergent outpatient pelvic ultrasound is recommended. 5. Coronary artery calcifications. Aortic Atherosclerosis (ICD10-I70.0). Electronically Signed   By: Katherine Mantle M.D.   On: 05/03/2020 18:11    Past Medical History:  Diagnosis Date  . Hypertension     Past Surgical History:   Procedure Laterality Date  . DILATION AND CURETTAGE OF UTERUS    . ESOPHAGOGASTRODUODENOSCOPY (EGD) WITH PROPOFOL N/A 05/04/2020   Procedure: ESOPHAGOGASTRODUODENOSCOPY (EGD) WITH PROPOFOL;  Surgeon: Napoleon Form, MD;  Location: [facility];  Service: Endoscopy;  Laterality: N/A;  . HIATAL HERNIA REPAIR N/A 05/06/2020   Procedure: LAPAROSCOPIC REPAIR OF HIATAL HERNIA;  Surgeon: Luretha Murphy, MD;  Location: [facility];  Service: General;  Laterality: N/A;  . LAPAROSCOPIC NISSEN FUNDOPLICATION N/A 05/06/2020   Procedure: POSSIBLE LAPAROSCOPIC NISSEN FUNDOPLICATION;  Surgeon: Luretha Murphy, MD;  Location: [facility];  Service: General;  Laterality: N/A;    Social History   Socioeconomic History  . Marital status: Single    Spouse name: Not on file  . Number of children: Not on file  . Years of education: Not on file  . Highest education level: Not on file  Occupational History  . Not on file  Tobacco Use  . Smoking status: Never Smoker  . Smokeless tobacco: Never Used  Substance and Sexual Activity  . Alcohol use: Never  . Drug use: Never  . Sexual activity: Not on file  Other Topics Concern  . Not on file  Social History Narrative  . Not on file   Social Determinants of Health   Financial Resource Strain: Not on file  Food Insecurity: Not on file  Transportation Needs: Not on file  Physical Activity: Not on file  Stress: Not on file  Social Connections: Not on file  Intimate Partner Violence: Not on file    History reviewed. No pertinent family history.  No current facility-administered medications for this encounter.   Current Outpatient Medications  Medication Sig Dispense Refill  . ondansetron (ZOFRAN) 4 MG tablet Take 1 tablet (4 mg total) by mouth every 8 (eight) hours as needed for nausea. 8 tablet 5  . telmisartan-hydrochlorothiazide (MICARDIS HCT) 40-12.5 MG tablet Take 1 tablet by mouth daily.    Marland Kitchen oxyCODONE (OXY IR/ROXICODONE) 5 MG immediate release  tablet Take 1-2 tablets (5-10 mg total) by mouth every 6 (six) hours as needed for moderate pain or severe pain. 30 tablet 0     Allergies  Allergen Reactions  . 3-Methyl-2-Benzothiazolinone Hydrazone     Other reaction(s): Other  . Ezetimibe Other (See Comments)    Other reaction(s): Myalgias (intolerance)  . Sulfa Antibiotics Rash  . Statins Other (See Comments)    intolarence    Signed: Lorenso Courier, MD, FACS, MASCRS  Gastrointestinal and Minimally Invasive Surgery  [facility]  Surgery [address] [place] [id number] [phone] Fax [phone] Main/Paging  CONTACT INFORMATION: Weekday (9AM-5PM) concerns: Call [facility] main office at [phone] Weeknight (5PM-9AM) or Weekend/Holiday concerns: Check [email or web address] for General Surgery [facility] coverage (Please, do not use SecureChat as it is not reliable communication to operating surgeons for immediate patient care)      05/10/2020, 9:47 AM

## 2020-06-03 ENCOUNTER — Other Ambulatory Visit: Payer: Self-pay

## 2020-06-03 ENCOUNTER — Emergency Department (HOSPITAL_BASED_OUTPATIENT_CLINIC_OR_DEPARTMENT_OTHER)
Admission: EM | Admit: 2020-06-03 | Discharge: 2020-06-03 | Disposition: A | Discharge status: Home / Self Care | Payer: Medicare PPO | Attending: Emergency Medicine | Admitting: Emergency Medicine

## 2020-06-03 ENCOUNTER — Emergency Department (HOSPITAL_BASED_OUTPATIENT_CLINIC_OR_DEPARTMENT_OTHER): Payer: Medicare PPO

## 2020-06-03 ENCOUNTER — Encounter (HOSPITAL_BASED_OUTPATIENT_CLINIC_OR_DEPARTMENT_OTHER): Payer: Self-pay | Admitting: Emergency Medicine

## 2020-06-03 DIAGNOSIS — R000 Tachycardia, unspecified: Secondary | ICD-10-CM | POA: Diagnosis not present

## 2020-06-03 DIAGNOSIS — I2693 Single subsegmental pulmonary embolism without acute cor pulmonale: Secondary | ICD-10-CM | POA: Diagnosis not present

## 2020-06-03 DIAGNOSIS — M546 Pain in thoracic spine: Secondary | ICD-10-CM | POA: Insufficient documentation

## 2020-06-03 DIAGNOSIS — M549 Dorsalgia, unspecified: Secondary | ICD-10-CM | POA: Diagnosis present

## 2020-06-03 DIAGNOSIS — I10 Essential (primary) hypertension: Secondary | ICD-10-CM | POA: Insufficient documentation

## 2020-06-03 LAB — URINALYSIS, ROUTINE W REFLEX MICROSCOPIC
Bilirubin Urine: NEGATIVE
Glucose, UA: NEGATIVE mg/dL
Ketones, ur: NEGATIVE mg/dL
Leukocytes,Ua: NEGATIVE
Nitrite: NEGATIVE
Protein, ur: NEGATIVE mg/dL
Specific Gravity, Urine: 1.015 (ref 1.005–1.030)
pH: 5.5 (ref 5.0–8.0)

## 2020-06-03 LAB — COMPREHENSIVE METABOLIC PANEL
ALT: 23 U/L (ref 0–44)
AST: 20 U/L (ref 15–41)
Albumin: 3.9 g/dL (ref 3.5–5.0)
Alkaline Phosphatase: 60 U/L (ref 38–126)
Anion gap: 12 (ref 5–15)
BUN: 14 mg/dL (ref 8–23)
CO2: 25 mmol/L (ref 22–32)
Calcium: 9.7 mg/dL (ref 8.9–10.3)
Chloride: 100 mmol/L (ref 98–111)
Creatinine, Ser: 0.89 mg/dL (ref 0.44–1.00)
GFR, Estimated: >60 mL/min (ref >60)
Glucose, Bld: 121 mg/dL — ABNORMAL HIGH (ref 70–99)
Potassium: 2.9 mmol/L — ABNORMAL LOW (ref 3.5–5.1)
Sodium: 137 mmol/L (ref 135–145)
Total Bilirubin: 1.5 mg/dL — ABNORMAL HIGH (ref 0.3–1.2)
Total Protein: 7.4 g/dL (ref 6.5–8.1)

## 2020-06-03 LAB — CBC WITH DIFFERENTIAL/PLATELET
Abs Immature Granulocytes: 0.02 10*3/uL (ref 0.00–0.07)
Basophils Absolute: 0 10*3/uL (ref 0.0–0.1)
Basophils Relative: 1 %
Eosinophils Absolute: 0.1 10*3/uL (ref 0.0–0.5)
Eosinophils Relative: 2 %
HCT: 36.8 % (ref 36.0–46.0)
Hemoglobin: 12.8 g/dL (ref 12.0–15.0)
Immature Granulocytes: 0 %
Lymphocytes Relative: 19 %
Lymphs Abs: 1.2 10*3/uL (ref 0.7–4.0)
MCH: 29.6 pg (ref 26.0–34.0)
MCHC: 34.8 g/dL (ref 30.0–36.0)
MCV: 85.2 fL (ref 80.0–100.0)
Monocytes Absolute: 0.8 10*3/uL (ref 0.1–1.0)
Monocytes Relative: 13 %
Neutro Abs: 4 10*3/uL (ref 1.7–7.7)
Neutrophils Relative %: 65 %
Platelets: 203 10*3/uL (ref 150–400)
RBC: 4.32 MIL/uL (ref 3.87–5.11)
RDW: 12.4 % (ref 11.5–15.5)
WBC: 6.1 10*3/uL (ref 4.0–10.5)
nRBC: 0 % (ref 0.0–0.2)

## 2020-06-03 LAB — LIPASE, BLOOD: Lipase: 27 U/L (ref 11–51)

## 2020-06-03 LAB — TROPONIN I (HIGH SENSITIVITY): Troponin I (High Sensitivity): 2 ng/L (ref <18)

## 2020-06-03 LAB — URINALYSIS, MICROSCOPIC (REFLEX): WBC, UA: NONE SEEN WBC/hpf (ref 0–5)

## 2020-06-03 MED ORDER — APIXABAN 2.5 MG PO TABS
10.0000 mg | ORAL_TABLET | Freq: Once | ORAL | Status: AC
  Administered 2020-06-03: 10 mg via ORAL
  Filled 2020-06-03: qty 4

## 2020-06-03 MED ORDER — FENTANYL CITRATE (PF) 100 MCG/2ML IJ SOLN
50.0000 ug | Freq: Once | INTRAMUSCULAR | Status: AC
Start: 2020-06-03 — End: 2020-06-03
  Administered 2020-06-03: 50 ug via INTRAVENOUS
  Filled 2020-06-03: qty 2

## 2020-06-03 MED ORDER — OXYCODONE-ACETAMINOPHEN 5-325 MG PO TABS
0.5000 | ORAL_TABLET | Freq: Once | ORAL | Status: AC
  Administered 2020-06-03: 0.5 via ORAL
  Filled 2020-06-03: qty 1

## 2020-06-03 MED ORDER — ONDANSETRON HCL 4 MG/2ML IJ SOLN
INTRAMUSCULAR | Status: AC
  Administered 2020-06-03: 4 mg
  Filled 2020-06-03: qty 2

## 2020-06-03 MED ORDER — SODIUM CHLORIDE 0.9 % IV BOLUS
1000.0000 mL | Freq: Once | INTRAVENOUS | Status: AC
  Administered 2020-06-03: 1000 mL via INTRAVENOUS

## 2020-06-03 MED ORDER — APIXABAN (ELIQUIS) VTE STARTER PACK (10MG AND 5MG): ORAL_TABLET | ORAL | 0 refills | Status: DC

## 2020-06-03 MED ORDER — POTASSIUM CHLORIDE CRYS ER 20 MEQ PO TBCR
40.0000 meq | EXTENDED_RELEASE_TABLET | Freq: Once | ORAL | Status: AC
  Administered 2020-06-03: 40 meq via ORAL
  Filled 2020-06-03: qty 2

## 2020-06-03 MED ORDER — ONDANSETRON HCL 4 MG/2ML IJ SOLN: 4.0000 mg | Freq: Once | INTRAMUSCULAR | Status: AC

## 2020-06-03 MED ORDER — IOHEXOL 350 MG/ML SOLN
100.0000 mL | Freq: Once | INTRAVENOUS | Status: AC | PRN
  Administered 2020-06-03: 100 mL via INTRAVENOUS

## 2020-06-03 NOTE — ED Triage Notes (Signed)
Pt is c/o pain in her back on the right side  Pt states pain started today  Pt adds that it hurts to touch or take a deep breath  Denies injury

## 2020-06-03 NOTE — Progress Notes (Signed)
Pharmacy Medication Note  Contacted patient after her discharge this morning and provided her with medication counseling on her new apixaban prescription. Pt noted that she had not picked up her prescription but was planning to soon. Informed patient that if cost were an issue, she could get her prescription at the [facility] pharmacy where they could supply her with a coupon to cover the first month. Ms. Engelbrecht will plan to do that if it is not affordable at her normal pharmacy.  Ms. Luellen expressed understanding and was grateful for the education.   Lysle Pearl, PharmD, BCPS Clinical Pharmacist 06/03/2020 9:48 AM

## 2020-06-03 NOTE — ED Provider Notes (Signed)
[facility] EMERGENCY DEPARTMENT Provider Note  CSN: [id number] Arrival date & time: 06/03/20 3532    History Chief Complaint  Patient presents with  . Back Pain    HPI  Monica Lara is a 70 y.o. 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 She had been recovering well since her surgery on 3/10. She reports earlier this evening she had sudden onset of R sided back pain, mid back, non radiating to abdomen or R leg. No associated rash. No fever, N/V/D or dysuria. Pain is worse with some movements and with deep breath. She has not had cough or SOB.    Past Medical History:  Diagnosis Date  . Hypertension     Past Surgical History:  Procedure Laterality Date  . DILATION AND CURETTAGE OF UTERUS    . ESOPHAGOGASTRODUODENOSCOPY (EGD) WITH PROPOFOL N/A 05/04/2020   Procedure: ESOPHAGOGASTRODUODENOSCOPY (EGD) WITH PROPOFOL;  Surgeon: Napoleon Form, MD;  Location: [facility] ENDOSCOPY;  Service: Endoscopy;  Laterality: N/A;  . HIATAL HERNIA REPAIR N/A 05/06/2020   Procedure: LAPAROSCOPIC REPAIR OF HIATAL HERNIA;  Surgeon: Luretha Murphy, MD;  Location: [facility] ORS;  Service: General;  Laterality: N/A;  . LAPAROSCOPIC NISSEN FUNDOPLICATION N/A 05/06/2020   Procedure: POSSIBLE LAPAROSCOPIC NISSEN FUNDOPLICATION;  Surgeon: Luretha Murphy, MD;  Location: [facility] ORS;  Service: General;  Laterality: N/A;    History reviewed. No pertinent family history.  Social History   Tobacco Use  . Smoking status: Never Smoker  . Smokeless tobacco: Never Used  Substance Use Topics  . Alcohol use: Never  . Drug use: Never     Home Medications Prior to Admission medications   Medication Sig Start Date End Date Taking? Authorizing Provider  ondansetron (ZOFRAN) 4 MG tablet Take 1 tablet (4 mg total) by mouth every 8 (eight) hours as needed for nausea. 05/09/20   Karie Soda, MD  oxyCODONE (OXY  IR/ROXICODONE) 5 MG immediate release tablet Take 1-2 tablets (5-10 mg total) by mouth every 6 (six) hours as needed for moderate pain or severe pain. 05/09/20   Karie Soda, MD  telmisartan-hydrochlorothiazide (MICARDIS HCT) 40-12.5 MG tablet Take 1 tablet by mouth daily. 08/21/19   [provider]     Allergies    3-methyl-2-benzothiazolinone hydrazone, Ezetimibe, Sulfa antibiotics, and Statins   Review of Systems   Review of Systems A comprehensive review of systems was completed and negative except as noted in HPI.    Physical Exam BP 136/74 (BP Location: Right Arm)   Pulse (!) 108   Temp 98.6 F (37 C) (Oral)   Resp 16   Ht 5' (1.524 m)   Wt 59.9 kg   SpO2 98%   BMI 25.78 kg/m   Physical Exam Vitals and nursing note reviewed.  Constitutional:      Appearance: Normal appearance.  HENT:     Head: Normocephalic and atraumatic.     Nose: Nose normal.     Mouth/Throat:     Mouth: Mucous membranes are moist.  Eyes:     Extraocular Movements: Extraocular movements intact.     Conjunctiva/sclera: Conjunctivae normal.  Cardiovascular:     Rate and Rhythm: Tachycardia present.  Pulmonary:     Effort: Pulmonary effort is normal.     Breath sounds: Normal breath sounds.  Abdominal:     General: Abdomen is flat.     Palpations: Abdomen is soft.     Tenderness: There is no  abdominal tenderness.  Musculoskeletal:        General: Tenderness (mild tenderness over the R lower thoracic paraspinal muscles) present. No swelling. Normal range of motion.     Cervical back: Neck supple.  Skin:    General: Skin is warm and dry.     Findings: No rash (no vesicular rash).  Neurological:     General: No focal deficit present.     Mental Status: She is alert.  Psychiatric:        Mood and Affect: Mood normal.      ED Results / Procedures / Treatments   Labs (all labs ordered are listed, but only abnormal results are displayed) Labs Reviewed  COMPREHENSIVE METABOLIC  PANEL  LIPASE, BLOOD  CBC WITH DIFFERENTIAL/PLATELET  URINALYSIS, ROUTINE W REFLEX MICROSCOPIC    EKG None   Radiology No results found.  Procedures Procedures  Medications Ordered in the ED Medications  sodium chloride 0.9 % bolus 1,000 mL (has no administration in time range)  fentaNYL (SUBLIMAZE) injection 50 mcg (has no administration in time range)     MDM Rules/Calculators/A&P MDM Patient with pleuritic R sided back pain, mild tenderness but not reproducible to a great extent. Consider surgical complication, post-op PE, post-op infection or early shingles. Could also be MSK and unrelated to recent surgery. Will check labs and CTA to rule out PE or other pulmonary etiologies.  ED Course  I have reviewed the triage vital signs and the nursing notes.  Pertinent labs & imaging results that were available during my care of the patient were reviewed by me and considered in my medical decision making (see chart for details).  Clinical Course as of 06/03/20 0659  Thu Jun 03, 2020  0419 UA does not show signs of infection. There is Large blood but no RBC, color is normal.  [CS]  0429 CBC is normal.  [CS]  0452 CMP/Lipase with hypokalemia, otherwise normal. Will replete orally.  [CS]  0545 CTA images reviewed, appears to show a segmental PE in RLL, awaiting official rads read. EKG and Trop added.  [CS]  [id number] Radiology confirms PE, no signs of right heart strain. Will discuss safety of anticoagulation in recent post-op patient with Gen Surg on call.  [CS]  0609 Spoke with Dr. Clovis Pu, on call for Gen Surg who will defer decision on anticoagulation to patient's primary surgeon Dr. Daphine Deutscher who should be in the office later this morning to discuss.  [CS]  0610 Patient's pain has returned. She is reluctant to take opiates due to side effects, but will try half a tab of Percocet.  [CS]  [phone] Spoke with Dr. Daphine Deutscher who does not object to beginning oral anticoagulation. Recommends PCP  follow up for long term management and he will continue to follow up for post-op management.  [CS]    Clinical Course User Index [CS] Pollyann Savoy, MD    Final Clinical Impression(s) / ED Diagnoses Final diagnoses:  Single subsegmental pulmonary embolism without acute cor pulmonale [facility])    Rx / DC Orders ED Discharge Orders    None       Pollyann Savoy, MD 06/03/20 [phone]

## 2020-06-07 DIAGNOSIS — I2699 Other pulmonary embolism without acute cor pulmonale: Secondary | ICD-10-CM | POA: Insufficient documentation

## 2020-06-07 DIAGNOSIS — I7121 Aneurysm of the ascending aorta, without rupture: Secondary | ICD-10-CM | POA: Insufficient documentation

## 2020-06-18 ENCOUNTER — Telehealth: Payer: Self-pay

## 2020-06-18 NOTE — Telephone Encounter (Signed)
Monica Lara,Monica Lara is looking for a new physician and requesting you as her new  physician her previous physician  Dr.Haimes and he is retiring

## 2020-07-07 LAB — LIPID PANEL
Cholesterol: 283 — AB (ref 0–200)
HDL: 47 (ref 35–70)
LDL Cholesterol: 223
Triglycerides: 142 (ref 40–160)

## 2020-07-07 LAB — TSH: TSH: 2.06 (ref 0.41–5.90)

## 2020-11-15 ENCOUNTER — Encounter: Payer: Self-pay | Admitting: Internal Medicine

## 2020-11-15 ENCOUNTER — Ambulatory Visit: Payer: Medicare PPO | Admitting: Internal Medicine

## 2020-11-15 ENCOUNTER — Other Ambulatory Visit: Payer: Self-pay

## 2020-11-15 VITALS — BP 142/82 | HR 77 | Temp 97.9°F | Resp 16 | Ht 59.75 in | Wt 119.0 lb

## 2020-11-15 DIAGNOSIS — E876 Hypokalemia: Secondary | ICD-10-CM | POA: Diagnosis not present

## 2020-11-15 DIAGNOSIS — Z23 Encounter for immunization: Secondary | ICD-10-CM

## 2020-11-15 DIAGNOSIS — T502X5A Adverse effect of carbonic-anhydrase inhibitors, benzothiadiazides and other diuretics, initial encounter: Secondary | ICD-10-CM | POA: Diagnosis not present

## 2020-11-15 DIAGNOSIS — N838 Other noninflammatory disorders of ovary, fallopian tube and broad ligament: Secondary | ICD-10-CM

## 2020-11-15 DIAGNOSIS — Z1211 Encounter for screening for malignant neoplasm of colon: Secondary | ICD-10-CM

## 2020-11-15 DIAGNOSIS — R739 Hyperglycemia, unspecified: Secondary | ICD-10-CM | POA: Diagnosis not present

## 2020-11-15 DIAGNOSIS — I10 Essential (primary) hypertension: Secondary | ICD-10-CM | POA: Diagnosis not present

## 2020-11-15 DIAGNOSIS — E785 Hyperlipidemia, unspecified: Secondary | ICD-10-CM

## 2020-11-15 DIAGNOSIS — N1832 Chronic kidney disease, stage 3b: Secondary | ICD-10-CM | POA: Diagnosis not present

## 2020-11-15 DIAGNOSIS — Z1231 Encounter for screening mammogram for malignant neoplasm of breast: Secondary | ICD-10-CM

## 2020-11-15 DIAGNOSIS — E782 Mixed hyperlipidemia: Secondary | ICD-10-CM

## 2020-11-15 LAB — CBC WITH DIFFERENTIAL/PLATELET
Basophils Absolute: 0.1 10*3/uL (ref 0.0–0.1)
Basophils Relative: 1.4 % (ref 0.0–3.0)
Eosinophils Absolute: 0.1 10*3/uL (ref 0.0–0.7)
Eosinophils Relative: 2.7 % (ref 0.0–5.0)
HCT: 43 % (ref 36.0–46.0)
Hemoglobin: 14.4 g/dL (ref 12.0–15.0)
Lymphocytes Relative: 32 % (ref 12.0–46.0)
Lymphs Abs: 1.5 10*3/uL (ref 0.7–4.0)
MCHC: 33.4 g/dL (ref 30.0–36.0)
MCV: 85.7 fl (ref 78.0–100.0)
Monocytes Absolute: 0.4 10*3/uL (ref 0.1–1.0)
Monocytes Relative: 8.8 % (ref 3.0–12.0)
Neutro Abs: 2.5 10*3/uL (ref 1.4–7.7)
Neutrophils Relative %: 55.1 % (ref 43.0–77.0)
Platelets: 208 10*3/uL (ref 150.0–400.0)
RBC: 5.02 Mil/uL (ref 3.87–5.11)
RDW: 14.1 % (ref 11.5–15.5)
WBC: 4.6 10*3/uL (ref 4.0–10.5)

## 2020-11-15 LAB — BASIC METABOLIC PANEL
BUN: 22 mg/dL (ref 6–23)
CO2: 29 mEq/L (ref 19–32)
Calcium: 10.3 mg/dL (ref 8.4–10.5)
Chloride: 100 mEq/L (ref 96–112)
Creatinine, Ser: 1 mg/dL (ref 0.40–1.20)
GFR: 57.59 mL/min — ABNORMAL LOW (ref >60.00)
Glucose, Bld: 92 mg/dL (ref 70–99)
Potassium: 3.7 mEq/L (ref 3.5–5.1)
Sodium: 137 mEq/L (ref 135–145)

## 2020-11-15 LAB — HEMOGLOBIN A1C: Hgb A1c MFr Bld: 5.7 % (ref 4.6–6.5)

## 2020-11-15 LAB — MAGNESIUM: Magnesium: 2.3 mg/dL (ref 1.5–2.5)

## 2020-11-15 NOTE — Patient Instructions (Signed)

## 2020-11-15 NOTE — Progress Notes (Signed)
Subjective:  Patient ID: Monica Lara, female    DOB: 1950-12-03  Age: 70 y.o. MRN: [id number]  CC: New Patient (Initial Visit) (Establish care) and Hypertension  This visit occurred during the SARS-CoV-2 public health emergency.  Safety protocols were in place, including screening questions prior to the visit, additional usage of staff PPE, and extensive cleaning of exam room while observing appropriate contact time as indicated for disinfecting solutions.    HPI Monica Lara presents for f/up and to establish.  She underwent emergent surgery for a gastric volvulus about 5 months ago.  On the CT scan she was found to have an atrophic left kidney and a right pelvic mass possibly associated with the ovary.  She tells me her blood pressure has been well controlled.  She denies headache, blurred vision, chest pain, shortness of breath, or edema.  History Monica Lara has a past medical history of Hypertension.   She has a past surgical history that includes Dilation and curettage of uterus; Esophagogastroduodenoscopy (egd) with propofol (N/A, 05/04/2020); Hiatal hernia repair (N/A, 05/06/2020); and Laparoscopic Nissen fundoplication (N/A, 05/06/2020).   Her family history includes Congestive Heart Failure in her father; Hypertension in her father.She reports that she has never smoked. She has never used smokeless tobacco. She reports that she does not drink alcohol and does not use drugs.  Outpatient Medications Prior to Visit  Medication Sig Dispense Refill   telmisartan-hydrochlorothiazide (MICARDIS HCT) 40-12.5 MG tablet Take 1 tablet by mouth daily.     APIXABAN (ELIQUIS) VTE STARTER PACK (10MG  AND 5MG ) Take as directed on package: start with two-5mg  tablets twice daily for 7 days. On day 8, switch to one-5mg  tablet twice daily. 1 tablet 0   ondansetron (ZOFRAN) 4 MG tablet Take 1 tablet (4 mg total) by mouth every 8 (eight) hours as needed for nausea. 8 tablet 5   oxyCODONE (OXY IR/ROXICODONE) 5 MG  immediate release tablet Take 1-2 tablets (5-10 mg total) by mouth every 6 (six) hours as needed for moderate pain or severe pain. 30 tablet 0   No facility-administered medications prior to visit.    ROS Review of Systems  Constitutional:  Negative for appetite change, diaphoresis, fatigue and unexpected weight change.  HENT: Negative.    Eyes:  Negative for visual disturbance.  Respiratory:  Negative for cough, chest tightness, shortness of breath and wheezing.   Cardiovascular:  Negative for chest pain, palpitations and leg swelling.  Gastrointestinal:  Negative for abdominal pain, constipation and diarrhea.  Endocrine: Negative.   Genitourinary: Negative.  Negative for difficulty urinating.  Musculoskeletal:  Negative for arthralgias and myalgias.  Skin: Negative.  Negative for color change.  Neurological:  Negative for dizziness, weakness and light-headedness.  Hematological:  Negative for adenopathy. Does not bruise/bleed easily.  Psychiatric/Behavioral: Negative.     Objective:  BP (!) 142/82 (BP Location: Left Arm, Patient Position: Sitting, Cuff Size: Normal)   Pulse 77   Temp 97.9 F (36.6 C) (Oral)   Resp 16   Ht 4' 11.75" (1.518 m)   Wt 119 lb (54 kg)   SpO2 98%   BMI 23.44 kg/m   Physical Exam Vitals reviewed.  Constitutional:      Appearance: Normal appearance.  HENT:     Nose: Nose normal.     Mouth/Throat:     Mouth: Mucous membranes are moist.  Eyes:     Conjunctiva/sclera: Conjunctivae normal.  Cardiovascular:     Rate and Rhythm: Normal rate and regular rhythm.  Heart sounds: No murmur heard. Pulmonary:     Effort: Pulmonary effort is normal.     Breath sounds: No stridor. No wheezing or rhonchi.  Abdominal:     General: Abdomen is flat. Bowel sounds are normal. There is no distension.     Palpations: Abdomen is soft. There is no hepatomegaly, splenomegaly or mass.     Tenderness: There is no abdominal tenderness.  Musculoskeletal:         General: Normal range of motion.     Cervical back: Neck supple.     Right lower leg: No edema.     Left lower leg: No edema.  Lymphadenopathy:     Cervical: No cervical adenopathy.  Skin:    General: Skin is warm and dry.     Coloration: Skin is not pale.  Neurological:     General: No focal deficit present.     Mental Status: She is alert.  Psychiatric:        Mood and Affect: Mood normal.    Lab Results  Component Value Date   WBC 4.6 11/15/2020   HGB 14.4 11/15/2020   HCT 43.0 11/15/2020   PLT 208.0 11/15/2020   GLUCOSE 92 11/15/2020   CHOL 283 (A) 07/07/2020   TRIG 142 07/07/2020   HDL 47 07/07/2020   LDLCALC 223 07/07/2020   ALT 23 06/03/2020   AST 20 06/03/2020   NA 137 11/15/2020   K 3.7 11/15/2020   CL 100 11/15/2020   CREATININE 1.00 11/15/2020   BUN 22 11/15/2020   CO2 29 11/15/2020   TSH 2.06 07/07/2020   INR 1.0 05/06/2020   HGBA1C 5.7 11/15/2020    IMPRESSION: 1. No evidence for aortic dissection. 2. 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 This process appears to be at least partially obstructing. As such, surgical consultation is recommended. 3. Atrophic left kidney. 4. There is a 3.8 cm cystic mass in the right hemipelvis. Is difficult to determine if this is associated with the right ovary or separate from the right ovary. Follow-up with a nonemergent outpatient pelvic ultrasound is recommended. 5. Coronary artery calcifications.   Aortic Atherosclerosis (ICD10-I70.0).     Electronically Signed   By: Katherine Mantle M.D.   On: 05/03/2020 18:11   Assessment & Plan:   Monica Lara was seen today for new patient (initial visit) and hypertension.  Diagnoses and all orders for this visit:  Essential hypertension- Her blood pressure is adequately well controlled. -     Basic metabolic panel; Future -     CBC with Differential/Platelet; Future -     CBC with Differential/Platelet -     Basic  metabolic panel  Need for immunization against influenza -     Flu Vaccine QUAD High Dose(Fluad)  Diuretic-induced hypokalemia- Her potassium level is normal now. -     Basic metabolic panel; Future -     Magnesium; Future -     Magnesium -     Basic metabolic panel  Hyperglycemia -     Basic metabolic panel; Future -     Hemoglobin A1c; Future -     Hemoglobin A1c -     Basic metabolic panel  Visit for screening mammogram -     MM DIGITAL SCREENING BILATERAL; Future  Stage 3b chronic kidney disease (HCC)- I recommended that she see nephrology. -     Ambulatory referral to Nephrology  Ovarian mass, right -     US Pelvic  Complete With Transvaginal; Future  Mixed hyperlipidemia  Hyperlipidemia LDL goal <160- She is not willing to take a statin.  I recommended that she start using a PCSK9 inhibitor. -     Discontinue: Evolocumab (REPATHA) 140 MG/ML SOSY; Inject 1 Act into the skin every 14 (fourteen) days. -     Evolocumab (REPATHA) 140 MG/ML SOSY; Inject 140 mg into the skin every 14 (fourteen) days.  Colon cancer screening -     Cologuard  I have discontinued Adelyna Pilkington's oxyCODONE, ondansetron, and Apixaban Starter Pack (10mg  and 5mg ). I have also changed her Repatha. Additionally, I am having her maintain her telmisartan-hydrochlorothiazide.  Meds ordered this encounter  Medications   DISCONTD: Evolocumab (REPATHA) 140 MG/ML SOSY    Sig: Inject 1 Act into the skin every 14 (fourteen) days.    Dispense:  6.3 mL    Refill:  1   Evolocumab (REPATHA) 140 MG/ML SOSY    Sig: Inject 140 mg into the skin every 14 (fourteen) days.    Dispense:  6.3 mL    Refill:  1      Follow-up: Return in about 3 months (around 02/14/2021).  , MD

## 2020-11-16 ENCOUNTER — Encounter: Payer: Self-pay | Admitting: Internal Medicine

## 2020-11-16 ENCOUNTER — Telehealth: Payer: Self-pay

## 2020-11-16 ENCOUNTER — Other Ambulatory Visit: Payer: Self-pay | Admitting: Internal Medicine

## 2020-11-16 DIAGNOSIS — E785 Hyperlipidemia, unspecified: Secondary | ICD-10-CM | POA: Insufficient documentation

## 2020-11-16 DIAGNOSIS — Z1211 Encounter for screening for malignant neoplasm of colon: Secondary | ICD-10-CM | POA: Insufficient documentation

## 2020-11-16 DIAGNOSIS — N838 Other noninflammatory disorders of ovary, fallopian tube and broad ligament: Secondary | ICD-10-CM | POA: Insufficient documentation

## 2020-11-16 MED ORDER — REPATHA 140 MG/ML ~~LOC~~ SOSY: 1.0000 | PREFILLED_SYRINGE | SUBCUTANEOUS | 1 refills | Status: DC

## 2020-11-16 MED ORDER — REPATHA 140 MG/ML ~~LOC~~ SOSY
140.0000 mg | PREFILLED_SYRINGE | SUBCUTANEOUS | 1 refills | Status: DC
Start: 2020-11-16 — End: 2020-12-19

## 2020-11-16 NOTE — Telephone Encounter (Signed)
Key: [id number]

## 2020-11-16 NOTE — Telephone Encounter (Signed)
Approved 11/16/2020-05/15/2021

## 2020-11-17 ENCOUNTER — Telehealth: Payer: Self-pay

## 2020-11-18 ENCOUNTER — Ambulatory Visit
Admission: RE | Admit: 2020-11-18 | Discharge: 2020-11-18 | Disposition: A | Discharge status: Home / Self Care | Payer: Medicare PPO | Source: Ambulatory Visit | Attending: Internal Medicine | Admitting: Internal Medicine

## 2020-11-18 DIAGNOSIS — N838 Other noninflammatory disorders of ovary, fallopian tube and broad ligament: Secondary | ICD-10-CM

## 2020-11-23 ENCOUNTER — Other Ambulatory Visit: Payer: Self-pay | Admitting: Internal Medicine

## 2020-11-23 DIAGNOSIS — Z1231 Encounter for screening mammogram for malignant neoplasm of breast: Secondary | ICD-10-CM

## 2020-11-25 LAB — COLOGUARD: Cologuard: NEGATIVE

## 2020-12-15 ENCOUNTER — Encounter: Payer: Self-pay | Admitting: Internal Medicine

## 2020-12-19 ENCOUNTER — Other Ambulatory Visit: Payer: Self-pay | Admitting: Internal Medicine

## 2021-02-15 ENCOUNTER — Other Ambulatory Visit: Payer: Self-pay

## 2021-02-15 ENCOUNTER — Ambulatory Visit: Payer: Medicare PPO | Admitting: Internal Medicine

## 2021-02-15 ENCOUNTER — Encounter: Payer: Self-pay | Admitting: Internal Medicine

## 2021-02-15 ENCOUNTER — Ambulatory Visit (INDEPENDENT_AMBULATORY_CARE_PROVIDER_SITE_OTHER): Payer: Medicare PPO

## 2021-02-15 VITALS — BP 136/82 | HR 71 | Temp 98.0°F | Resp 16 | Ht 59.5 in | Wt 116.2 lb

## 2021-02-15 DIAGNOSIS — I10 Essential (primary) hypertension: Secondary | ICD-10-CM

## 2021-02-15 DIAGNOSIS — E7801 Familial hypercholesterolemia: Secondary | ICD-10-CM

## 2021-02-15 DIAGNOSIS — M19011 Primary osteoarthritis, right shoulder: Secondary | ICD-10-CM

## 2021-02-15 DIAGNOSIS — M79621 Pain in right upper arm: Secondary | ICD-10-CM | POA: Diagnosis not present

## 2021-02-15 DIAGNOSIS — N1832 Chronic kidney disease, stage 3b: Secondary | ICD-10-CM | POA: Diagnosis not present

## 2021-02-15 DIAGNOSIS — E876 Hypokalemia: Secondary | ICD-10-CM

## 2021-02-15 DIAGNOSIS — M25511 Pain in right shoulder: Secondary | ICD-10-CM

## 2021-02-15 DIAGNOSIS — E785 Hyperlipidemia, unspecified: Secondary | ICD-10-CM | POA: Diagnosis not present

## 2021-02-15 DIAGNOSIS — E78011 Heterozygous familial hypercholesterolemia (hefh): Secondary | ICD-10-CM

## 2021-02-15 DIAGNOSIS — T502X5A Adverse effect of carbonic-anhydrase inhibitors, benzothiadiazides and other diuretics, initial encounter: Secondary | ICD-10-CM

## 2021-02-15 LAB — BASIC METABOLIC PANEL
BUN: 17 mg/dL (ref 6–23)
CO2: 30 mEq/L (ref 19–32)
Calcium: 10.4 mg/dL (ref 8.4–10.5)
Chloride: 99 mEq/L (ref 96–112)
Creatinine, Ser: 0.89 mg/dL (ref 0.40–1.20)
GFR: 66.11 mL/min (ref >60.00)
Glucose, Bld: 89 mg/dL (ref 70–99)
Potassium: 3.2 mEq/L — ABNORMAL LOW (ref 3.5–5.1)
Sodium: 138 mEq/L (ref 135–145)

## 2021-02-15 LAB — LIPID PANEL
Cholesterol: 309 mg/dL — ABNORMAL HIGH (ref 0–200)
HDL: 61.2 mg/dL (ref >39.00)
LDL Cholesterol: 224 mg/dL — ABNORMAL HIGH (ref 0–99)
NonHDL: 247.38
Total CHOL/HDL Ratio: 5
Triglycerides: 117 mg/dL (ref 0.0–149.0)
VLDL: 23.4 mg/dL (ref 0.0–40.0)

## 2021-02-15 MED ORDER — POTASSIUM CHLORIDE CRYS ER 15 MEQ PO TBCR: 15.0000 meq | EXTENDED_RELEASE_TABLET | Freq: Two times a day (BID) | ORAL | 1 refills | Status: DC

## 2021-02-15 NOTE — Patient Instructions (Signed)

## 2021-02-15 NOTE — Progress Notes (Signed)
Subjective:  Patient ID: Monica Lara, female    DOB: December 13, 1950  Age: 70 y.o. MRN: [id number]  CC: Hypertension and Hyperlipidemia  This visit occurred during the SARS-CoV-2 public health emergency.  Safety protocols were in place, including screening questions prior to the visit, additional usage of staff PPE, and extensive cleaning of exam room while observing appropriate contact time as indicated for disinfecting solutions.    HPI Monica Lara presents for f/up -  She complains of a 2-week history of right shoulder pain that radiates into her right upper arm.  She denies trauma or injury but says the pain started after she reached for something.  She is not taking anything for pain.  She denies neck pain or joint swelling.  Outpatient Medications Prior to Visit  Medication Sig Dispense Refill   telmisartan-hydrochlorothiazide (MICARDIS HCT) 40-12.5 MG tablet Take 1 tablet by mouth daily.     No facility-administered medications prior to visit.    ROS Review of Systems  Constitutional:  Negative for chills, diaphoresis, fatigue and fever.  HENT: Negative.    Eyes: Negative.   Respiratory:  Negative for cough, chest tightness, shortness of breath and wheezing.   Cardiovascular:  Negative for chest pain, palpitations and leg swelling.  Gastrointestinal:  Negative for abdominal pain, constipation, diarrhea, nausea and vomiting.  Endocrine: Negative.   Genitourinary:  Negative for difficulty urinating.  Musculoskeletal:  Positive for arthralgias. Negative for myalgias.  Skin: Negative.   Allergic/Immunologic: Negative.   Neurological: Negative.  Negative for dizziness.  Hematological:  Negative for adenopathy. Does not bruise/bleed easily.  Psychiatric/Behavioral: Negative.     Objective:  BP 136/82 (BP Location: Left Arm, Patient Position: Sitting, Cuff Size: Normal)    Pulse 71    Temp 98 F (36.7 C) (Oral)    Resp 16    Ht 4' 11.5" (1.511 m)    Wt 116 lb 3.2 oz (52.7 kg)     SpO2 98%    BMI 23.08 kg/m   BP Readings from Last 3 Encounters:  02/15/21 136/82  11/15/20 (!) 142/82  06/03/20 (!) 146/82    Wt Readings from Last 3 Encounters:  02/15/21 116 lb 3.2 oz (52.7 kg)  11/15/20 119 lb (54 kg)  06/03/20 132 lb (59.9 kg)    Physical Exam Vitals reviewed.  HENT:     Right Ear: Tympanic membrane normal.     Nose: Nose normal.  Eyes:     General: No scleral icterus.    Conjunctiva/sclera: Conjunctivae normal.  Cardiovascular:     Rate and Rhythm: Normal rate and regular rhythm.     Heart sounds: No murmur heard. Pulmonary:     Effort: Pulmonary effort is normal.     Breath sounds: No stridor. No wheezing, rhonchi or rales.  Abdominal:     General: Abdomen is flat.     Palpations: There is no mass.     Tenderness: There is no abdominal tenderness. There is no guarding.     Hernia: No hernia is present.  Musculoskeletal:     Right shoulder: No swelling, effusion, tenderness or bony tenderness. Decreased range of motion.     Left shoulder: Normal.     Right upper arm: Normal. No swelling, edema or deformity.     Left upper arm: Normal. No swelling, edema or deformity.     Cervical back: Neck supple.     Right lower leg: No edema.     Left lower leg: No edema.  Lymphadenopathy:  Cervical: No cervical adenopathy.  Skin:    General: Skin is warm and dry.     Coloration: Skin is not pale.     Findings: No rash.  Neurological:     General: No focal deficit present.     Mental Status: She is alert.  Psychiatric:        Mood and Affect: Mood normal.        Behavior: Behavior normal.    Lab Results  Component Value Date   WBC 4.6 11/15/2020   HGB 14.4 11/15/2020   HCT 43.0 11/15/2020   PLT 208.0 11/15/2020   GLUCOSE 89 02/15/2021   CHOL 309 (H) 02/15/2021   TRIG 117.0 02/15/2021   HDL 61.20 02/15/2021   LDLCALC 224 (H) 02/15/2021   ALT 23 06/03/2020   AST 20 06/03/2020   NA 138 02/15/2021   K 3.2 (L) 02/15/2021   CL 99 02/15/2021    CREATININE 0.89 02/15/2021   BUN 17 02/15/2021   CO2 30 02/15/2021   TSH 2.06 07/07/2020   INR 1.0 05/06/2020   HGBA1C 5.7 11/15/2020    US Pelvic Complete With Transvaginal  Result Date: 11/18/2020 CLINICAL DATA:  RIGHT ovarian mass, abnormal CT; postmenopausal EXAM: TRANSABDOMINAL AND TRANSVAGINAL ULTRASOUND OF PELVIS TECHNIQUE: Both transabdominal and transvaginal ultrasound examinations of the pelvis were performed. Transabdominal technique was performed for global imaging of the pelvis including uterus, ovaries, adnexal regions, and pelvic cul-de-sac. It was necessary to proceed with endovaginal exam following the transabdominal exam to visualize the endometrium and ovaries. COMPARISON:  CT chest abdomen pelvis 05/03/2020 FINDINGS: Uterus Measurements: 7.2 x 3.6 x 4.4 cm = volume: 60 mL. Anteverted. Heterogeneous myometrium. Probable hypoechoic intramural leiomyoma 15 x 11 x 11 mm intramural RIGHT uterus. Additional subserosal leiomyoma LEFT lateral uterus 19 x 13 x 12 mm. Endometrium Thickness: 2 mm. Small amount of nonspecific endometrial fluid. No discrete mass. Right ovary Measurements: 1.9 x 1.7 x 1.3 cm = volume: 2 mL. Small simple cyst RIGHT ovary 9 x 10 x 8 mm decreased in size from previous CT exam; no follow-up imaging is recommended. Reference: Radiology 2019 Nov;293(2):359-371 Left ovary Not visualized, likely obscured by bowel Other findings No free pelvic fluid.  No additional adnexal masses. IMPRESSION: 2 small uterine leiomyomata measuring 19 mm and 15 mm in greatest sizes. Interval decrease in size of RIGHT ovarian cyst now 10 mm diameter, no follow-up imaging recommended. Small amount of nonspecific endometrial fluid. Nonvisualization of LEFT ovary. Electronically Signed   By: Ulyses Southward M.D.   On: 11/18/2020 14:16    FINDINGS: There is no evidence of fracture or dislocation. Mild degenerative changes are seen involving the right acromioclavicular joint and right  glenohumeral articulation. Soft tissues are unremarkable.   IMPRESSION: Mild degenerative changes involving the right acromioclavicular joint and right glenohumeral articulation.     Electronically Signed   By: Aram Candela M.D.   On: 02/15/2021 21:48 Assessment & Plan:   Dailyn was seen today for hypertension and hyperlipidemia.  Diagnoses and all orders for this visit:  Essential hypertension- Her blood pressure is adequately well controlled but she continues to be hypokalemic and does not tolerate potassium supplements.  Will discontinue the thiazide diuretic and will start spironolactone.  Will continue the current dose of telmisartan. -     Basic metabolic panel; Future -     Basic metabolic panel -     Discontinue: potassium chloride SA (KLOR-CON M15) 15 MEQ tablet; Take 1 tablet (15 mEq total)  by mouth 2 (two) times daily. -     telmisartan (MICARDIS) 40 MG tablet; Take 1 tablet (40 mg total) by mouth daily. -     spironolactone (ALDACTONE) 25 MG tablet; Take 1 tablet (25 mg total) by mouth daily.  Stage 3b chronic kidney disease (HCC)- Her renal function is stable. -     Basic metabolic panel; Future -     Basic metabolic panel  Hyperlipidemia LDL goal <160- I recommended she treat this with a statin and bempedoic acid. -     Lipid panel; Future -     Lipid panel -     pravastatin (PRAVACHOL) 40 MG tablet; Take 1 tablet (40 mg total) by mouth daily.  Acute pain of right shoulder -     DG Shoulder Right; Future  Pain in right upper arm -     DG Humerus Right; Future  Diuretic-induced hypokalemia -     Discontinue: potassium chloride SA (KLOR-CON M15) 15 MEQ tablet; Take 1 tablet (15 mEq total) by mouth 2 (two) times daily. -     spironolactone (ALDACTONE) 25 MG tablet; Take 1 tablet (25 mg total) by mouth daily.  Primary osteoarthritis of right shoulder -     Ambulatory referral to Orthopedic Surgery  Heterozygous familial hypercholesterolemia -     Bempedoic  Acid (NEXLETOL) 180 MG TABS; Take 1 tablet by mouth daily.   I have discontinued Nilaya Heiney's telmisartan-hydrochlorothiazide and potassium chloride SA. I am also having her start on pravastatin, Nexletol, telmisartan, and spironolactone.  Meds ordered this encounter  Medications   DISCONTD: potassium chloride SA (KLOR-CON M15) 15 MEQ tablet    Sig: Take 1 tablet (15 mEq total) by mouth 2 (two) times daily.    Dispense:  180 tablet    Refill:  1   pravastatin (PRAVACHOL) 40 MG tablet    Sig: Take 1 tablet (40 mg total) by mouth daily.    Dispense:  90 tablet    Refill:  1   Bempedoic Acid (NEXLETOL) 180 MG TABS    Sig: Take 1 tablet by mouth daily.    Dispense:  90 tablet    Refill:  1   telmisartan (MICARDIS) 40 MG tablet    Sig: Take 1 tablet (40 mg total) by mouth daily.    Dispense:  90 tablet    Refill:  1   spironolactone (ALDACTONE) 25 MG tablet    Sig: Take 1 tablet (25 mg total) by mouth daily.    Dispense:  90 tablet    Refill:  0     Follow-up: Return in about 6 months (around 08/16/2021).  Sanda Linger, MD

## 2021-02-16 ENCOUNTER — Telehealth: Payer: Self-pay | Admitting: Internal Medicine

## 2021-02-16 DIAGNOSIS — M19011 Primary osteoarthritis, right shoulder: Secondary | ICD-10-CM | POA: Insufficient documentation

## 2021-02-16 DIAGNOSIS — E7801 Familial hypercholesterolemia: Secondary | ICD-10-CM | POA: Insufficient documentation

## 2021-02-16 MED ORDER — NEXLETOL 180 MG PO TABS: 1.0000 | ORAL_TABLET | Freq: Every day | ORAL | 1 refills | Status: DC

## 2021-02-16 MED ORDER — PRAVASTATIN SODIUM 40 MG PO TABS: 40.0000 mg | ORAL_TABLET | Freq: Every day | ORAL | 1 refills | Status: DC

## 2021-02-16 NOTE — Telephone Encounter (Signed)
Patient calling in about medication  Patient says last time she was in the ED she was given med potassium chloride SA (KLOR-CON M15) 15 MEQ tablet & it made her sick.Marland Kitchen throwing up all night  Patient wants to know if there is another kind of potassium med she can take that wont upset her stomach   Please call [phone]

## 2021-02-17 ENCOUNTER — Encounter: Payer: Self-pay | Admitting: Orthopedic Surgery

## 2021-02-17 ENCOUNTER — Other Ambulatory Visit: Payer: Self-pay

## 2021-02-17 ENCOUNTER — Ambulatory Visit: Payer: Medicare PPO | Admitting: Orthopedic Surgery

## 2021-02-17 ENCOUNTER — Encounter: Payer: Self-pay | Admitting: Internal Medicine

## 2021-02-17 DIAGNOSIS — M7541 Impingement syndrome of right shoulder: Secondary | ICD-10-CM | POA: Diagnosis not present

## 2021-02-17 MED ORDER — TELMISARTAN 40 MG PO TABS: 40.0000 mg | ORAL_TABLET | Freq: Every day | ORAL | 1 refills | Status: DC

## 2021-02-17 MED ORDER — METHYLPREDNISOLONE ACETATE 40 MG/ML IJ SUSP
40.0000 mg | INTRAMUSCULAR | Status: AC | PRN
  Administered 2021-02-17: 14:00:00 40 mg via INTRA_ARTICULAR

## 2021-02-17 MED ORDER — LIDOCAINE HCL 1 % IJ SOLN
5.0000 mL | INTRAMUSCULAR | Status: AC | PRN
  Administered 2021-02-17: 14:00:00 5 mL

## 2021-02-17 MED ORDER — SPIRONOLACTONE 25 MG PO TABS: 25.0000 mg | ORAL_TABLET | Freq: Every day | ORAL | 0 refills | Status: DC

## 2021-02-17 NOTE — Progress Notes (Signed)
Office Visit Note   Patient: Monica Lara           Date of Birth: [birth year]           MRN: [id number] Visit Date: 02/17/2021              Requested by: Janith Lima, MD [address] [place],  [place] [address] PCP: Janith Lima, MD  Chief Complaint  Patient presents with   Right Shoulder - Pain      HPI: Patient is a 70 year old woman who is seen for initial evaluation for right shoulder pain.  She has pain with internal rotation and pain with abduction and flexion.  She states this has been going on for 2 weeks she denies any specific trauma.  Patient states that she is on a diuretic for hypertension and states that she does have low potassium.  Assessment & Plan: Visit Diagnoses:  1. Impingement syndrome of right shoulder     Plan: Recommended coconut water to help with her potassium supplement she was given instructions for internal and external rotation strengthening as well as scapular stabilization.  Follow-Up Instructions: Return in about 4 weeks (around 03/17/2021).   Ortho Exam  Patient is alert, oriented, no adenopathy, well-dressed, normal affect, normal respiratory effort. Examination patient has abduction and flexion of both shoulders to about 120 degrees.  The biceps tendon is nontender to palpation the [facility] joint is nontender to palpation.  Patient does have pain with Neer and Hawkins impingement test.  Imaging: No results found. No images are attached to the encounter.  Labs: Lab Results  Component Value Date   HGBA1C 5.7 11/15/2020     Lab Results  Component Value Date   ALBUMIN 3.9 06/03/2020   ALBUMIN 3.6 05/06/2020   ALBUMIN 4.7 05/03/2020   PREALBUMIN 19.4 05/06/2020    Lab Results  Component Value Date   MG 2.3 11/15/2020   No results found for: [facility]  Lab Results  Component Value Date   PREALBUMIN 19.4 05/06/2020   CBC EXTENDED Latest Ref Rng & Units 11/15/2020 06/03/2020 05/07/2020  WBC 4.0 - 10.5 K/uL 4.6 6.1 8.2  RBC  3.87 - 5.11 Mil/uL 5.02 4.32 3.88  HGB 12.0 - 15.0 g/dL 14.4 12.8 11.5(L)  HCT 36.0 - 46.0 % 43.0 36.8 34.6(L)  PLT 150.0 - 400.0 K/uL 208.0 203 199  NEUTROABS 1.4 - 7.7 K/uL 2.5 4.0 6.1  LYMPHSABS 0.7 - 4.0 K/uL 1.5 1.2 1.1     There is no height or weight on file to calculate BMI.  Orders:  No orders of the defined types were placed in this encounter.  No orders of the defined types were placed in this encounter.    Procedures: Large Joint Inj: R subacromial bursa on 02/17/2021 1:55 PM Indications: diagnostic evaluation and pain Details: 22 G 1.5 in needle, posterior approach  Arthrogram: No  Medications: 5 mL lidocaine 1 %; 40 mg methylPREDNISolone acetate 40 MG/ML Outcome: tolerated well, no immediate complications Procedure, treatment alternatives, risks and benefits explained, specific risks discussed. Consent was given by the patient. Immediately prior to procedure a time out was called to verify the correct patient, procedure, equipment, support staff and site/side marked as required. Patient was prepped and draped in the usual sterile fashion.     Clinical Data: No additional findings.  ROS:  All other systems negative, except as noted in the HPI. Review of Systems  Objective: Vital Signs: There were no vitals taken for this visit.  Specialty Comments:  No specialty comments available.  PMFS History: Patient Active Problem List   Diagnosis Date Noted   Primary osteoarthritis of right shoulder 02/16/2021   Heterozygous familial hypercholesterolemia 02/16/2021   Pain in right upper arm 02/15/2021   Acute pain of right shoulder 02/15/2021   Ovarian mass, right 11/16/2020   Hyperlipidemia LDL goal <160 11/16/2020   Colon cancer screening 11/16/2020   Diuretic-induced hypokalemia 11/15/2020   Visit for screening mammogram 11/15/2020   Stage 3b chronic kidney disease (HCC) 11/15/2020   Benign essential microscopic hematuria 04/29/2018   Osteoporosis  03/20/2017   Age-related nuclear cataract of both eyes 03/31/2015   Essential hypertension 03/31/2015   Fatty liver 03/31/2015   Mixed hyperlipidemia 03/31/2015   Past Medical History:  Diagnosis Date   Hypertension     Family History  Problem Relation Age of Onset   Hypertension Father    Congestive Heart Failure Father     Past Surgical History:  Procedure Laterality Date   DILATION AND CURETTAGE OF UTERUS     ESOPHAGOGASTRODUODENOSCOPY (EGD) WITH PROPOFOL N/A 05/04/2020   Procedure: ESOPHAGOGASTRODUODENOSCOPY (EGD) WITH PROPOFOL;  Surgeon: Napoleon Form, MD;  Location: [facility];  Service: Endoscopy;  Laterality: N/A;   HIATAL HERNIA REPAIR N/A 05/06/2020   Procedure: LAPAROSCOPIC REPAIR OF HIATAL HERNIA;  Surgeon: Luretha Murphy, MD;  Location: [facility];  Service: General;  Laterality: N/A;   LAPAROSCOPIC NISSEN FUNDOPLICATION N/A 05/06/2020   Procedure: POSSIBLE LAPAROSCOPIC NISSEN FUNDOPLICATION;  Surgeon: Luretha Murphy, MD;  Location: [facility];  Service: General;  Laterality: N/A;   Social History   Occupational History   Not on file  Tobacco Use   Smoking status: Never   Smokeless tobacco: Never  Vaping Use   Vaping Use: Never used  Substance and Sexual Activity   Alcohol use: Never   Drug use: Never   Sexual activity: Not on file

## 2021-03-10 ENCOUNTER — Encounter (HOSPITAL_BASED_OUTPATIENT_CLINIC_OR_DEPARTMENT_OTHER): Payer: Self-pay

## 2021-03-10 ENCOUNTER — Ambulatory Visit (HOSPITAL_BASED_OUTPATIENT_CLINIC_OR_DEPARTMENT_OTHER)
Admission: RE | Admit: 2021-03-10 | Discharge: 2021-03-10 | Disposition: A | Discharge status: Home / Self Care | Payer: Medicare PPO | Source: Ambulatory Visit | Attending: Internal Medicine | Admitting: Internal Medicine

## 2021-03-10 ENCOUNTER — Other Ambulatory Visit: Payer: Self-pay

## 2021-03-10 DIAGNOSIS — Z1231 Encounter for screening mammogram for malignant neoplasm of breast: Secondary | ICD-10-CM | POA: Insufficient documentation

## 2021-03-17 ENCOUNTER — Other Ambulatory Visit: Payer: Self-pay

## 2021-03-17 ENCOUNTER — Ambulatory Visit (INDEPENDENT_AMBULATORY_CARE_PROVIDER_SITE_OTHER): Payer: Medicare PPO | Admitting: Orthopedic Surgery

## 2021-03-17 DIAGNOSIS — M7501 Adhesive capsulitis of right shoulder: Secondary | ICD-10-CM

## 2021-03-17 DIAGNOSIS — M7541 Impingement syndrome of right shoulder: Secondary | ICD-10-CM

## 2021-04-03 ENCOUNTER — Encounter: Payer: Self-pay | Admitting: Orthopedic Surgery

## 2021-04-03 NOTE — Progress Notes (Signed)
Office Visit Note   Patient: Monica Lara           Date of Birth: [birth year]           MRN: [id number] Visit Date: 03/17/2021              Requested by: Janith Lima, MD [address] [place],  [place] [address] PCP: Janith Lima, MD  Chief Complaint  Patient presents with   Right Shoulder - Follow-up      HPI: Patient is a 71 year old woman who presents in follow-up for the right shoulder.  Patient states the shoulder feels better but not great still certain range of motion's are painful.  Patient states it took more than 3 days to obtain relief from the injection.  Assessment & Plan: Visit Diagnoses: No diagnosis found.  Plan: With the limited range of motion patient does have adhesive capsulitis recommended range of motion exercises and Voltaren gel discussed that if she does not achieve sufficient relief we could proceed with arthroscopic debridement of the adhesions to the subscapularis.  Follow-Up Instructions: Return if symptoms worsen or fail to improve.   Ortho Exam  Patient is alert, oriented, no adenopathy, well-dressed, normal affect, normal respiratory effort. Examination patient has abduction flexion to 70 degrees internal rotation of 20 degrees external rotation of 20 degrees.  Imaging: No results found. No images are attached to the encounter.  Labs: Lab Results  Component Value Date   HGBA1C 5.7 11/15/2020     Lab Results  Component Value Date   ALBUMIN 3.9 06/03/2020   ALBUMIN 3.6 05/06/2020   ALBUMIN 4.7 05/03/2020   PREALBUMIN 19.4 05/06/2020    Lab Results  Component Value Date   MG 2.3 11/15/2020   No results found for: [facility]  Lab Results  Component Value Date   PREALBUMIN 19.4 05/06/2020   CBC EXTENDED Latest Ref Rng & Units 11/15/2020 06/03/2020 05/07/2020  WBC 4.0 - 10.5 K/uL 4.6 6.1 8.2  RBC 3.87 - 5.11 Mil/uL 5.02 4.32 3.88  HGB 12.0 - 15.0 g/dL 14.4 12.8 11.5(L)  HCT 36.0 - 46.0 % 43.0 36.8 34.6(L)  PLT 150.0 - 400.0  K/uL 208.0 203 199  NEUTROABS 1.4 - 7.7 K/uL 2.5 4.0 6.1  LYMPHSABS 0.7 - 4.0 K/uL 1.5 1.2 1.1     There is no height or weight on file to calculate BMI.  Orders:  No orders of the defined types were placed in this encounter.  No orders of the defined types were placed in this encounter.    Procedures: No procedures performed  Clinical Data: No additional findings.  ROS:  All other systems negative, except as noted in the HPI. Review of Systems  Objective: Vital Signs: There were no vitals taken for this visit.  Specialty Comments:  No specialty comments available.  PMFS History: Patient Active Problem List   Diagnosis Date Noted   Primary osteoarthritis of right shoulder 02/16/2021   Heterozygous familial hypercholesterolemia 02/16/2021   Pain in right upper arm 02/15/2021   Acute pain of right shoulder 02/15/2021   Ovarian mass, right 11/16/2020   Hyperlipidemia LDL goal <160 11/16/2020   Colon cancer screening 11/16/2020   Diuretic-induced hypokalemia 11/15/2020   Visit for screening mammogram 11/15/2020   Stage 3b chronic kidney disease ([place]) 11/15/2020   Benign essential microscopic hematuria 04/29/2018   Osteoporosis 03/20/2017   Age-related nuclear cataract of both eyes 03/31/2015   Essential hypertension 03/31/2015   Fatty liver 03/31/2015   Mixed  hyperlipidemia 03/31/2015   Past Medical History:  Diagnosis Date   Hypertension     Family History  Problem Relation Age of Onset   Hypertension Father    Congestive Heart Failure Father     Past Surgical History:  Procedure Laterality Date   DILATION AND CURETTAGE OF UTERUS     ESOPHAGOGASTRODUODENOSCOPY (EGD) WITH PROPOFOL N/A 05/04/2020   Procedure: ESOPHAGOGASTRODUODENOSCOPY (EGD) WITH PROPOFOL;  Surgeon: Mauri Pole, MD;  Location: [facility];  Service: Endoscopy;  Laterality: N/A;   HIATAL HERNIA REPAIR N/A 05/06/2020   Procedure: LAPAROSCOPIC REPAIR OF HIATAL HERNIA;  Surgeon: Johnathan Hausen, MD;  Location: [facility];  Service: General;  Laterality: N/A;   LAPAROSCOPIC NISSEN FUNDOPLICATION N/A [id number]   Procedure: POSSIBLE LAPAROSCOPIC NISSEN FUNDOPLICATION;  Surgeon: Johnathan Hausen, MD;  Location: [facility];  Service: General;  Laterality: N/A;   Social History   Occupational History   Not on file  Tobacco Use   Smoking status: Never   Smokeless tobacco: Never  Vaping Use   Vaping Use: Never used  Substance and Sexual Activity   Alcohol use: Never   Drug use: Never   Sexual activity: Not on file

## 2021-05-02 ENCOUNTER — Other Ambulatory Visit: Payer: Self-pay

## 2021-05-02 ENCOUNTER — Encounter: Payer: Self-pay | Admitting: Internal Medicine

## 2021-05-02 ENCOUNTER — Ambulatory Visit: Payer: Medicare PPO | Admitting: Internal Medicine

## 2021-05-02 VITALS — BP 136/84 | HR 68 | Temp 97.7°F | Resp 16 | Ht 59.5 in | Wt 116.0 lb

## 2021-05-02 DIAGNOSIS — N1832 Chronic kidney disease, stage 3b: Secondary | ICD-10-CM

## 2021-05-02 DIAGNOSIS — I10 Essential (primary) hypertension: Secondary | ICD-10-CM | POA: Diagnosis not present

## 2021-05-02 DIAGNOSIS — T502X5A Adverse effect of carbonic-anhydrase inhibitors, benzothiadiazides and other diuretics, initial encounter: Secondary | ICD-10-CM

## 2021-05-02 DIAGNOSIS — E785 Hyperlipidemia, unspecified: Secondary | ICD-10-CM

## 2021-05-02 DIAGNOSIS — E7801 Familial hypercholesterolemia: Secondary | ICD-10-CM

## 2021-05-02 DIAGNOSIS — Z1159 Encounter for screening for other viral diseases: Secondary | ICD-10-CM | POA: Diagnosis not present

## 2021-05-02 DIAGNOSIS — E876 Hypokalemia: Secondary | ICD-10-CM | POA: Diagnosis not present

## 2021-05-02 DIAGNOSIS — M19011 Primary osteoarthritis, right shoulder: Secondary | ICD-10-CM

## 2021-05-02 LAB — CBC WITH DIFFERENTIAL/PLATELET
Basophils Absolute: 0 10*3/uL (ref 0.0–0.1)
Basophils Relative: 0.4 % (ref 0.0–3.0)
Eosinophils Absolute: 0.1 10*3/uL (ref 0.0–0.7)
Eosinophils Relative: 2.9 % (ref 0.0–5.0)
HCT: 39.9 % (ref 36.0–46.0)
Hemoglobin: 13.7 g/dL (ref 12.0–15.0)
Lymphocytes Relative: 37.6 % (ref 12.0–46.0)
Lymphs Abs: 1.6 10*3/uL (ref 0.7–4.0)
MCHC: 34.5 g/dL (ref 30.0–36.0)
MCV: 87.2 fl (ref 78.0–100.0)
Monocytes Absolute: 0.3 10*3/uL (ref 0.1–1.0)
Monocytes Relative: 7.9 % (ref 3.0–12.0)
Neutro Abs: 2.2 10*3/uL (ref 1.4–7.7)
Neutrophils Relative %: 51.2 % (ref 43.0–77.0)
Platelets: 206 10*3/uL (ref 150.0–400.0)
RBC: 4.57 Mil/uL (ref 3.87–5.11)
RDW: 13.5 % (ref 11.5–15.5)
WBC: 4.3 10*3/uL (ref 4.0–10.5)

## 2021-05-02 LAB — BASIC METABOLIC PANEL
BUN: 27 mg/dL — ABNORMAL HIGH (ref 6–23)
CO2: 26 mEq/L (ref 19–32)
Calcium: 10.3 mg/dL (ref 8.4–10.5)
Chloride: 102 mEq/L (ref 96–112)
Creatinine, Ser: 1.01 mg/dL (ref 0.40–1.20)
GFR: 56.72 mL/min — ABNORMAL LOW (ref >60.00)
Glucose, Bld: 87 mg/dL (ref 70–99)
Potassium: 4.4 mEq/L (ref 3.5–5.1)
Sodium: 137 mEq/L (ref 135–145)

## 2021-05-02 MED ORDER — SPIRONOLACTONE 25 MG PO TABS: 25.0000 mg | ORAL_TABLET | Freq: Every day | ORAL | 1 refills | Status: DC

## 2021-05-02 MED ORDER — TELMISARTAN 40 MG PO TABS: 40.0000 mg | ORAL_TABLET | Freq: Every day | ORAL | 1 refills | Status: DC

## 2021-05-02 NOTE — Patient Instructions (Signed)
Shoulder Pain °Many things can cause shoulder pain, including: °An injury to the shoulder. °Overuse of the shoulder. °Arthritis. °The source of the pain can be: °Inflammation. °An injury to the shoulder joint. °An injury to a tendon, ligament, or bone. °Follow these instructions at home: °Pay attention to changes in your symptoms. Let your health care provider know about them. Follow these instructions to relieve your pain. °If you have a sling: °Wear the sling as told by your health care provider. Remove it only as told by your health care provider. °Loosen the sling if your fingers tingle, become numb, or turn cold and blue. °Keep the sling clean. °If the sling is not waterproof: °Do not let it get wet. Remove it to shower or bathe. °Move your arm as little as possible, but keep your hand moving to prevent swelling. °Managing pain, stiffness, and swelling ° °If directed, put ice on the painful area: °Put ice in a plastic bag. °Place a towel between your skin and the bag. °Leave the ice on for 20 minutes, 2-3 times per day. Stop applying ice if it does not help with the pain. °Squeeze a soft ball or a foam pad as much as possible. This helps to keep the shoulder from swelling. It also helps to strengthen the arm. °General instructions °Take over-the-counter and prescription medicines only as told by your health care provider. °Keep all follow-up visits as told by your health care provider. This is important. °Contact a health care provider if: °Your pain gets worse. °Your pain is not relieved with medicines. °New pain develops in your arm, hand, or fingers. °Get help right away if: °Your arm, hand, or fingers: °Tingle. °Become numb. °Become swollen. °Become painful. °Turn white or blue. °Summary °Shoulder pain can be caused by an injury, overuse, or arthritis. °Pay attention to changes in your symptoms. Let your health care provider know about them. °This condition may be treated with a sling, ice, and pain  medicines. °Contact your health care provider if the pain gets worse or new pain develops. Get help right away if your arm, hand, or fingers tingle or become numb, swollen, or painful. °Keep all follow-up visits as told by your health care provider. This is important. °This information is not intended to replace advice given to you by your health care provider. Make sure you discuss any questions you have with your health care provider. °Document Revised: 08/28/2017 Document Reviewed: 08/28/2017 °Elsevier Patient Education © 2022 [employer] ° °

## 2021-05-02 NOTE — Progress Notes (Signed)
? ?Subjective:  ?Patient ID: Monica Lara, female    DOB: 1950/11/01  Age: 71 y.o. MRN: [id number] ? ?CC: Hypertension and Hyperlipidemia ? ?This visit occurred during the SARS-CoV-2 public health emergency.  Safety protocols were in place, including screening questions prior to the visit, additional usage of staff PPE, and extensive cleaning of exam room while observing appropriate contact time as indicated for disinfecting solutions.   ? ?HPI ?Monica Lara presents for f/up -  ? ?She has decided not to take any meds for LDL reduction.  She complains of worsening right shoulder pain and decreased range of motion.  She saw an orthopedic surgeon and had an injection of the shoulder but she said that made it worse.  She wants to undergo an MRI to see if she has an issue that would be amenable to surgery.  She tells me her blood pressure is well controlled.  She denies dizziness, lightheadedness, chest pain, or dyspnea on exertion. ? ?Outpatient Medications Prior to Visit  ?Medication Sig Dispense Refill  ? spironolactone (ALDACTONE) 25 MG tablet Take 1 tablet (25 mg total) by mouth daily. 90 tablet 0  ? telmisartan (MICARDIS) 40 MG tablet Take 1 tablet (40 mg total) by mouth daily. 90 tablet 1  ? Bempedoic Acid (NEXLETOL) 180 MG TABS Take 1 tablet by mouth daily. 90 tablet 1  ? pravastatin (PRAVACHOL) 40 MG tablet Take 1 tablet (40 mg total) by mouth daily. 90 tablet 1  ? ?No facility-administered medications prior to visit.  ? ? ?ROS ?Review of Systems  ?Constitutional: Negative.  Negative for chills, diaphoresis and fatigue.  ?Eyes: Negative.   ?Respiratory:  Negative for cough, chest tightness, shortness of breath and wheezing.   ?Cardiovascular:  Negative for chest pain, palpitations and leg swelling.  ?Gastrointestinal:  Negative for abdominal pain, constipation, diarrhea, nausea and vomiting.  ?Genitourinary: Negative.  Negative for difficulty urinating, dysuria and hematuria.  ?Musculoskeletal:  Positive for  arthralgias. Negative for myalgias.  ?Skin: Negative.  Negative for color change and pallor.  ?Neurological: Negative.  Negative for dizziness and weakness.  ?Hematological:  Negative for adenopathy. Does not bruise/bleed easily.  ?Psychiatric/Behavioral: Negative.    ? ?Objective:  ?BP 136/84 (BP Location: Left Arm, Patient Position: Sitting, Cuff Size: Normal)   Pulse 68   Temp 97.7 ?F (36.5 ?C) (Oral)   Resp 16   Ht 4' 11.5" (1.511 m)   Wt 116 lb (52.6 kg)   SpO2 97%   BMI 23.04 kg/m?  ? ?BP Readings from Last 3 Encounters:  ?05/02/21 136/84  ?02/15/21 136/82  ?11/15/20 (!) 142/82  ? ? ?Wt Readings from Last 3 Encounters:  ?05/02/21 116 lb (52.6 kg)  ?02/15/21 116 lb 3.2 oz (52.7 kg)  ?11/15/20 119 lb (54 kg)  ? ? ?Physical Exam ?Vitals reviewed.  ?HENT:  ?   Nose: Nose normal.  ?   Mouth/Throat:  ?   Mouth: Mucous membranes are moist.  ?Eyes:  ?   General: No scleral icterus. ?   Conjunctiva/sclera: Conjunctivae normal.  ?Cardiovascular:  ?   Rate and Rhythm: Normal rate and regular rhythm.  ?   Heart sounds: No murmur heard. ?Pulmonary:  ?   Effort: Pulmonary effort is normal.  ?   Breath sounds: No stridor. No wheezing, rhonchi or rales.  ?Abdominal:  ?   General: Abdomen is flat.  ?   Palpations: There is no mass.  ?   Tenderness: There is no abdominal tenderness. There is no guarding.  ?  Hernia: No hernia is present.  ?Musculoskeletal:     ?   General: Normal range of motion.  ?   Cervical back: Neck supple.  ?   Right lower leg: No edema.  ?   Left lower leg: No edema.  ?Lymphadenopathy:  ?   Cervical: No cervical adenopathy.  ?Skin: ?   General: Skin is warm.  ?Neurological:  ?   General: No focal deficit present.  ? ? ?Lab Results  ?Component Value Date  ? WBC 4.3 05/02/2021  ? HGB 13.7 05/02/2021  ? HCT 39.9 05/02/2021  ? PLT 206.0 05/02/2021  ? GLUCOSE 87 05/02/2021  ? CHOL 309 (H) 02/15/2021  ? TRIG 117.0 02/15/2021  ? HDL 61.20 02/15/2021  ? LDLCALC 224 (H) 02/15/2021  ? ALT 23 06/03/2020  ?  AST 20 06/03/2020  ? NA 137 05/02/2021  ? K 4.4 05/02/2021  ? CL 102 05/02/2021  ? CREATININE 1.01 05/02/2021  ? BUN 27 (H) 05/02/2021  ? CO2 26 05/02/2021  ? TSH 2.06 07/07/2020  ? INR 1.0 05/06/2020  ? HGBA1C 5.7 11/15/2020  ? ? ?MM 3D SCREEN BREAST BILATERAL ? ?Result Date: 03/10/2021 ?CLINICAL DATA:  Screening. EXAM: DIGITAL SCREENING BILATERAL MAMMOGRAM WITH TOMOSYNTHESIS AND CAD TECHNIQUE: Bilateral screening digital craniocaudal and mediolateral oblique mammograms were obtained. Bilateral screening digital breast tomosynthesis was performed. The images were evaluated with computer-aided detection. COMPARISON:  Previous exam(s). ACR Breast Density Category b: There are scattered areas of fibroglandular density. FINDINGS: There are no findings suspicious for malignancy. IMPRESSION: No mammographic evidence of malignancy. A result letter of this screening mammogram will be mailed directly to the patient. RECOMMENDATION: Screening mammogram in one year. (Code:SM-B-01Y) BI-RADS CATEGORY  1: Negative. Electronically Signed   By: Sherian Rein M.D.   On: 03/10/2021 11:33  ? ? ?Assessment & Plan:  ? ?Monica Lara was seen today for hypertension and hyperlipidemia. ? ?Diagnoses and all orders for this visit: ? ?Need for hepatitis C screening test ?-     Hepatitis C antibody; Future ?-     Hepatitis C antibody ? ?Essential hypertension- Her blood pressure is well controlled.  Electrolytes are normal. ?-     CBC with Differential/Platelet; Future ?-     CBC with Differential/Platelet ?-     spironolactone (ALDACTONE) 25 MG tablet; Take 1 tablet (25 mg total) by mouth daily. ?-     telmisartan (MICARDIS) 40 MG tablet; Take 1 tablet (40 mg total) by mouth daily. ? ?Diuretic-induced hypokalemia- Her potassium level is normal.  Will continue the current dose of spironolactone. ?-     Basic metabolic panel; Future ?-     Basic metabolic panel ?-     spironolactone (ALDACTONE) 25 MG tablet; Take 1 tablet (25 mg total) by mouth  daily. ? ?Hyperlipidemia LDL goal <160- She is not willing to take a statin. ?-     Cancel: Lipid panel; Future ? ?Stage 3b chronic kidney disease (HCC)- Her renal function is stable.  She will avoid nephrotoxic agents. ?-     Basic metabolic panel; Future ?-     Basic metabolic panel ? ?Heterozygous familial hypercholesterolemia ?-     Cancel: Lipid panel; Future ? ?Primary osteoarthritis of right shoulder ?-     MR Shoulder Right Wo Contrast; Future ? ? ?I have discontinued Monica Lara's pravastatin and Nexletol. I am also having her maintain her spironolactone and telmisartan. ? ?Meds ordered this encounter  ?Medications  ? spironolactone (ALDACTONE) 25 MG tablet  ?  Sig: Take 1 tablet (25 mg total) by mouth daily.  ?  Dispense:  90 tablet  ?  Refill:  1  ? telmisartan (MICARDIS) 40 MG tablet  ?  Sig: Take 1 tablet (40 mg total) by mouth daily.  ?  Dispense:  90 tablet  ?  Refill:  1  ? ? ? ?Follow-up: Return in about 6 months (around 11/02/2021). ? ?Sanda Linger, MD ?

## 2021-05-03 LAB — HEPATITIS C ANTIBODY
Hepatitis C Ab: NONREACTIVE
SIGNAL TO CUT-OFF: <0.02 (ref <1.00)

## 2021-05-05 ENCOUNTER — Ambulatory Visit
Admission: RE | Admit: 2021-05-05 | Discharge: 2021-05-05 | Disposition: A | Discharge status: Home / Self Care | Payer: Medicare PPO | Source: Ambulatory Visit | Attending: Internal Medicine | Admitting: Internal Medicine

## 2021-05-05 ENCOUNTER — Encounter: Payer: Self-pay | Admitting: Internal Medicine

## 2021-05-05 DIAGNOSIS — M19011 Primary osteoarthritis, right shoulder: Secondary | ICD-10-CM

## 2021-05-09 ENCOUNTER — Telehealth: Payer: Self-pay | Admitting: Internal Medicine

## 2021-05-09 ENCOUNTER — Other Ambulatory Visit: Payer: Self-pay | Admitting: Internal Medicine

## 2021-05-09 DIAGNOSIS — M7501 Adhesive capsulitis of right shoulder: Secondary | ICD-10-CM | POA: Insufficient documentation

## 2021-05-09 NOTE — Telephone Encounter (Signed)
Pt informed referral entered. ?

## 2021-05-09 NOTE — Telephone Encounter (Signed)
Patient requesting a referral for physical therapy instead of an ortho regarding her shoulder ? ? ?

## 2021-05-13 ENCOUNTER — Other Ambulatory Visit: Payer: Self-pay | Admitting: Internal Medicine

## 2021-05-13 DIAGNOSIS — I10 Essential (primary) hypertension: Secondary | ICD-10-CM

## 2021-06-21 ENCOUNTER — Telehealth: Payer: Self-pay

## 2021-06-21 NOTE — Telephone Encounter (Signed)
Pt is calling stating that insurance company say she will need to extend the end date for PT so it will be covered under her plan.  ? ?I advise pt to let PT know that Dr. Ronnald Ramp will need to know the est treatment appt they anticipate her needing for the extension. ? ?Please advise ? ? ?

## 2021-06-28 DIAGNOSIS — M25511 Pain in right shoulder: Secondary | ICD-10-CM | POA: Diagnosis not present

## 2021-07-05 DIAGNOSIS — M25511 Pain in right shoulder: Secondary | ICD-10-CM | POA: Diagnosis not present

## 2021-07-07 DIAGNOSIS — M25511 Pain in right shoulder: Secondary | ICD-10-CM | POA: Diagnosis not present

## 2021-07-14 DIAGNOSIS — M25511 Pain in right shoulder: Secondary | ICD-10-CM | POA: Diagnosis not present

## 2021-07-26 DIAGNOSIS — M25511 Pain in right shoulder: Secondary | ICD-10-CM | POA: Diagnosis not present

## 2021-08-03 DIAGNOSIS — M25511 Pain in right shoulder: Secondary | ICD-10-CM | POA: Diagnosis not present

## 2021-08-11 DIAGNOSIS — M25511 Pain in right shoulder: Secondary | ICD-10-CM | POA: Diagnosis not present

## 2021-08-22 ENCOUNTER — Encounter: Payer: Self-pay | Admitting: Internal Medicine

## 2021-08-22 ENCOUNTER — Ambulatory Visit: Payer: Medicare PPO | Admitting: Internal Medicine

## 2021-08-22 VITALS — BP 128/82 | HR 85 | Temp 98.1°F | Ht 59.5 in | Wt 119.0 lb

## 2021-08-22 DIAGNOSIS — E2839 Other primary ovarian failure: Secondary | ICD-10-CM

## 2021-08-22 DIAGNOSIS — I10 Essential (primary) hypertension: Secondary | ICD-10-CM | POA: Diagnosis not present

## 2021-08-22 DIAGNOSIS — N1832 Chronic kidney disease, stage 3b: Secondary | ICD-10-CM

## 2021-08-22 DIAGNOSIS — T502X5A Adverse effect of carbonic-anhydrase inhibitors, benzothiadiazides and other diuretics, initial encounter: Secondary | ICD-10-CM | POA: Diagnosis not present

## 2021-08-22 DIAGNOSIS — E785 Hyperlipidemia, unspecified: Secondary | ICD-10-CM | POA: Diagnosis not present

## 2021-08-22 DIAGNOSIS — E876 Hypokalemia: Secondary | ICD-10-CM

## 2021-08-22 LAB — BASIC METABOLIC PANEL
BUN: 22 mg/dL (ref 6–23)
CO2: 28 mEq/L (ref 19–32)
Calcium: 9.9 mg/dL (ref 8.4–10.5)
Chloride: 103 mEq/L (ref 96–112)
Creatinine, Ser: 0.95 mg/dL (ref 0.40–1.20)
GFR: 60.91 mL/min (ref >60.00)
Glucose, Bld: 91 mg/dL (ref 70–99)
Potassium: 4 mEq/L (ref 3.5–5.1)
Sodium: 139 mEq/L (ref 135–145)

## 2021-08-22 NOTE — Progress Notes (Signed)
Subjective:  Patient ID: Monica Lara, female    DOB: [birth year]  Age: 71 y.o. MRN: [id number]  CC: Hypertension and Hyperlipidemia   HPI Monica Lara presents for f/up -  She is active and denies chest pain, shortness of breath, diaphoresis, dizziness, lightheadedness, or edema.  Outpatient Medications Prior to Visit  Medication Sig Dispense Refill   spironolactone (ALDACTONE) 25 MG tablet Take 1 tablet (25 mg total) by mouth daily. 90 tablet 1   telmisartan (MICARDIS) 40 MG tablet Take 1 tablet (40 mg total) by mouth daily. 90 tablet 1   No facility-administered medications prior to visit.    ROS Review of Systems  Constitutional: Negative.  Negative for chills, diaphoresis and fatigue.  HENT: Negative.    Eyes: Negative.   Respiratory:  Negative for cough, chest tightness, shortness of breath and wheezing.   Cardiovascular:  Negative for chest pain, palpitations and leg swelling.  Gastrointestinal:  Negative for abdominal pain, constipation, diarrhea, nausea and vomiting.  Endocrine: Negative.   Genitourinary: Negative.  Negative for difficulty urinating.  Musculoskeletal: Negative.  Negative for arthralgias.  Skin: Negative.   Neurological:  Negative for dizziness, weakness and light-headedness.  Hematological:  Negative for adenopathy. Does not bruise/bleed easily.  Psychiatric/Behavioral: Negative.      Objective:  BP 128/82 (BP Location: Right Arm, Patient Position: Sitting, Cuff Size: Large)   Pulse 85   Temp 98.1 F (36.7 C) (Oral)   Ht 4' 11.5" (1.511 m)   Wt 119 lb (54 kg)   SpO2 96%   BMI 23.63 kg/m   BP Readings from Last 3 Encounters:  08/22/21 128/82  05/02/21 136/84  02/15/21 136/82    Wt Readings from Last 3 Encounters:  08/22/21 119 lb (54 kg)  05/02/21 116 lb (52.6 kg)  02/15/21 116 lb 3.2 oz (52.7 kg)    Physical Exam Vitals reviewed.  HENT:     Nose: Nose normal.     Mouth/Throat:     Mouth: Mucous membranes are moist.  Eyes:      General: No scleral icterus.    Conjunctiva/sclera: Conjunctivae normal.  Cardiovascular:     Rate and Rhythm: Normal rate and regular rhythm.     Heart sounds: No murmur heard. Pulmonary:     Effort: Pulmonary effort is normal.     Breath sounds: No stridor. No wheezing, rhonchi or rales.  Abdominal:     General: Abdomen is flat.     Palpations: There is no mass.     Tenderness: There is no abdominal tenderness. There is no guarding.     Hernia: No hernia is present.  Musculoskeletal:        General: Normal range of motion.     Right lower leg: No edema.     Left lower leg: No edema.  Lymphadenopathy:     Cervical: No cervical adenopathy.  Skin:    General: Skin is warm and dry.  Neurological:     General: No focal deficit present.     Mental Status: She is alert.  Psychiatric:        Mood and Affect: Mood normal.        Behavior: Behavior normal.     Lab Results  Component Value Date   WBC 4.3 05/02/2021   HGB 13.7 05/02/2021   HCT 39.9 05/02/2021   PLT 206.0 05/02/2021   GLUCOSE 91 08/22/2021   CHOL 309 (H) 02/15/2021   TRIG 117.0 02/15/2021   HDL 61.20 02/15/2021   LDLCALC  224 (H) 02/15/2021   ALT 23 06/03/2020   AST 20 06/03/2020   NA 139 08/22/2021   K 4.0 08/22/2021   CL 103 08/22/2021   CREATININE 0.95 08/22/2021   BUN 22 08/22/2021   CO2 28 08/22/2021   TSH 2.06 07/07/2020   INR 1.0 05/06/2020   HGBA1C 5.7 11/15/2020    MR Shoulder Right Wo Contrast  Result Date: 05/05/2021 CLINICAL DATA:  Right shoulder pain with weakness and limited range of motion for 3 months. Clinical concern for adhesive capsulitis. EXAM: MRI OF THE RIGHT SHOULDER WITHOUT CONTRAST TECHNIQUE: Multiplanar, multisequence MR imaging of the shoulder was performed. No intravenous contrast was administered. COMPARISON:  X-ray shoulder 02/15/2021. FINDINGS: Rotator cuff: Mild tendinosis of the supraspinatus and infraspinatus tendon with fraying along the bursal surface. Teres minor tendon  is intact. Subscapularis tendon is intact. Muscles: No muscle atrophy or edema. No intramuscular fluid collection or hematoma. Biceps Long Head: Mild tendinosis of the intra-articular portion of the long head of the biceps tendon. Acromioclavicular Joint: Moderate arthropathy of the acromioclavicular joint. No subacromial/subdeltoid bursal fluid. Glenohumeral Joint: No joint effusion. No chondral defect. Thickening of the inferior joint capsule as can be seen with adhesive capsulitis. Labrum: Grossly intact, but evaluation is limited by lack of intraarticular fluid/contrast. Bones: No fracture or dislocation. No aggressive osseous lesion. Other: No fluid collection or hematoma. IMPRESSION: 1. Mild tendinosis of the supraspinatus and infraspinatus tendon with fraying along the bursal surface. 2. Mild tendinosis of the intra-articular portion of the long head of the biceps tendon. 3. Thickening of the inferior joint capsule as can be seen with adhesive capsulitis. Electronically Signed   By: Elige Ko M.D.   On: 05/05/2021 13:32    Assessment & Plan:   Monica Lara was seen today for hypertension and hyperlipidemia.  Diagnoses and all orders for this visit:  Stage 3b chronic kidney disease (HCC)- Her renal function is stable. -     Basic metabolic panel; Future -     Basic metabolic panel  Essential hypertension- Her blood pressure is adequately well controlled. -     Basic metabolic panel; Future -     Basic metabolic panel  Diuretic-induced hypokalemia -     Basic metabolic panel; Future -     Basic metabolic panel  Hyperlipidemia LDL goal <160- She is not willing to take a pharmaceutical agent to treat this.  Estrogen deficiency -     DG Bone Density; Future   I am having Monica Lara maintain her spironolactone and telmisartan.  No orders of the defined types were placed in this encounter.    Follow-up: Return in about 6 months (around 02/21/2022).  Sanda Linger, MD

## 2021-08-23 DIAGNOSIS — E2839 Other primary ovarian failure: Secondary | ICD-10-CM | POA: Insufficient documentation

## 2021-08-23 DIAGNOSIS — M25511 Pain in right shoulder: Secondary | ICD-10-CM | POA: Diagnosis not present

## 2021-09-06 DIAGNOSIS — M25511 Pain in right shoulder: Secondary | ICD-10-CM | POA: Diagnosis not present

## 2021-09-13 DIAGNOSIS — M25511 Pain in right shoulder: Secondary | ICD-10-CM | POA: Diagnosis not present

## 2021-09-20 DIAGNOSIS — M25511 Pain in right shoulder: Secondary | ICD-10-CM | POA: Diagnosis not present

## 2021-11-03 ENCOUNTER — Encounter: Payer: Self-pay | Admitting: Internal Medicine

## 2021-11-03 ENCOUNTER — Other Ambulatory Visit: Payer: Self-pay | Admitting: Internal Medicine

## 2021-11-03 DIAGNOSIS — E876 Hypokalemia: Secondary | ICD-10-CM

## 2021-11-03 DIAGNOSIS — I10 Essential (primary) hypertension: Secondary | ICD-10-CM

## 2021-12-27 DIAGNOSIS — N182 Chronic kidney disease, stage 2 (mild): Secondary | ICD-10-CM | POA: Diagnosis not present

## 2022-01-04 DIAGNOSIS — N261 Atrophy of kidney (terminal): Secondary | ICD-10-CM | POA: Diagnosis not present

## 2022-01-04 DIAGNOSIS — I2699 Other pulmonary embolism without acute cor pulmonale: Secondary | ICD-10-CM | POA: Diagnosis not present

## 2022-01-04 DIAGNOSIS — E785 Hyperlipidemia, unspecified: Secondary | ICD-10-CM | POA: Diagnosis not present

## 2022-01-04 DIAGNOSIS — I7121 Aneurysm of the ascending aorta, without rupture: Secondary | ICD-10-CM | POA: Diagnosis not present

## 2022-01-04 DIAGNOSIS — N182 Chronic kidney disease, stage 2 (mild): Secondary | ICD-10-CM | POA: Diagnosis not present

## 2022-01-04 DIAGNOSIS — I10 Essential (primary) hypertension: Secondary | ICD-10-CM | POA: Diagnosis not present

## 2022-01-23 ENCOUNTER — Telehealth: Payer: Self-pay | Admitting: Internal Medicine

## 2022-01-23 NOTE — Telephone Encounter (Signed)
Left message for patient to call back to schedule Medicare Annual Wellness Visit   No hx of AWV eligible as of 07/28/17  Please schedule at anytime with [facility] Advisor if patient calls the office back.     Any questions, please call me at [phone]

## 2022-01-29 ENCOUNTER — Other Ambulatory Visit: Payer: Self-pay | Admitting: Internal Medicine

## 2022-01-29 DIAGNOSIS — E876 Hypokalemia: Secondary | ICD-10-CM

## 2022-01-29 DIAGNOSIS — I10 Essential (primary) hypertension: Secondary | ICD-10-CM

## 2022-02-22 ENCOUNTER — Ambulatory Visit: Payer: Medicare PPO | Admitting: Internal Medicine

## 2022-02-22 ENCOUNTER — Encounter: Payer: Self-pay | Admitting: Internal Medicine

## 2022-02-22 VITALS — BP 118/78 | HR 75 | Temp 98.4°F | Ht 59.5 in | Wt 119.0 lb

## 2022-02-22 DIAGNOSIS — E785 Hyperlipidemia, unspecified: Secondary | ICD-10-CM | POA: Diagnosis not present

## 2022-02-22 DIAGNOSIS — I10 Essential (primary) hypertension: Secondary | ICD-10-CM

## 2022-02-22 DIAGNOSIS — E7801 Familial hypercholesterolemia: Secondary | ICD-10-CM | POA: Diagnosis not present

## 2022-02-22 DIAGNOSIS — K760 Fatty (change of) liver, not elsewhere classified: Secondary | ICD-10-CM

## 2022-02-22 DIAGNOSIS — I7121 Aneurysm of the ascending aorta, without rupture: Secondary | ICD-10-CM

## 2022-02-22 DIAGNOSIS — E2839 Other primary ovarian failure: Secondary | ICD-10-CM

## 2022-02-22 DIAGNOSIS — N1832 Chronic kidney disease, stage 3b: Secondary | ICD-10-CM | POA: Diagnosis not present

## 2022-02-22 DIAGNOSIS — Z0001 Encounter for general adult medical examination with abnormal findings: Secondary | ICD-10-CM

## 2022-02-22 LAB — CBC WITH DIFFERENTIAL/PLATELET
Basophils Absolute: 0.1 10*3/uL (ref 0.0–0.1)
Basophils Relative: 2.1 % (ref 0.0–3.0)
Eosinophils Absolute: 0.1 10*3/uL (ref 0.0–0.7)
Eosinophils Relative: 2.8 % (ref 0.0–5.0)
HCT: 42.1 % (ref 36.0–46.0)
Hemoglobin: 14.2 g/dL (ref 12.0–15.0)
Lymphocytes Relative: 32 % (ref 12.0–46.0)
Lymphs Abs: 1.7 10*3/uL (ref 0.7–4.0)
MCHC: 33.8 g/dL (ref 30.0–36.0)
MCV: 87.9 fl (ref 78.0–100.0)
Monocytes Absolute: 0.5 10*3/uL (ref 0.1–1.0)
Monocytes Relative: 8.6 % (ref 3.0–12.0)
Neutro Abs: 2.8 10*3/uL (ref 1.4–7.7)
Neutrophils Relative %: 54.5 % (ref 43.0–77.0)
Platelets: 268 10*3/uL (ref 150.0–400.0)
RBC: 4.79 Mil/uL (ref 3.87–5.11)
RDW: 13.1 % (ref 11.5–15.5)
WBC: 5.2 10*3/uL (ref 4.0–10.5)

## 2022-02-22 LAB — LIPID PANEL
Cholesterol: 311 mg/dL — ABNORMAL HIGH (ref 0–200)
HDL: 55.3 mg/dL (ref >39.00)
LDL Cholesterol: 224 mg/dL — ABNORMAL HIGH (ref 0–99)
NonHDL: 255.43
Total CHOL/HDL Ratio: 6
Triglycerides: 156 mg/dL — ABNORMAL HIGH (ref 0.0–149.0)
VLDL: 31.2 mg/dL (ref 0.0–40.0)

## 2022-02-22 LAB — BASIC METABOLIC PANEL
BUN: 22 mg/dL (ref 6–23)
CO2: 28 mEq/L (ref 19–32)
Calcium: 10 mg/dL (ref 8.4–10.5)
Chloride: 99 mEq/L (ref 96–112)
Creatinine, Ser: 1.12 mg/dL (ref 0.40–1.20)
GFR: 49.82 mL/min — ABNORMAL LOW (ref >60.00)
Glucose, Bld: 90 mg/dL (ref 70–99)
Potassium: 4.2 mEq/L (ref 3.5–5.1)
Sodium: 135 mEq/L (ref 135–145)

## 2022-02-22 LAB — HEPATIC FUNCTION PANEL
ALT: 12 U/L (ref 0–35)
AST: 17 U/L (ref 0–37)
Albumin: 4.6 g/dL (ref 3.5–5.2)
Alkaline Phosphatase: 60 U/L (ref 39–117)
Bilirubin, Direct: 0.2 mg/dL (ref 0.0–0.3)
Total Bilirubin: 1.4 mg/dL — ABNORMAL HIGH (ref 0.2–1.2)
Total Protein: 8 g/dL (ref 6.0–8.3)

## 2022-02-22 LAB — TSH: TSH: 1.77 u[IU]/mL (ref 0.35–5.50)

## 2022-02-22 NOTE — Progress Notes (Signed)
Subjective:  Patient ID: Monica Lara, female    DOB: October 20, 1950  Age: 71 y.o. 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  About 4 years ago she underwent an MRI with contrast and there was no evidence of carotid disease.  She would like to follow-up with an ENT doctor.  She is active and denies chest pain, shortness of breath, diaphoresis, or edema.  Outpatient Medications Prior to Visit  Medication Sig Dispense Refill   spironolactone (ALDACTONE) 25 MG tablet TAKE 1 TABLET (25 MG TOTAL) BY MOUTH DAILY. 90 tablet 0   telmisartan (MICARDIS) 40 MG tablet TAKE 1 TABLET BY MOUTH EVERY DAY 90 tablet 0   No facility-administered medications prior to visit.    ROS Review of Systems  Constitutional: Negative.  Negative for diaphoresis and fatigue.  HENT: Negative.    Eyes: Negative.   Respiratory:  Negative for cough, chest tightness, shortness of breath and wheezing.   Cardiovascular:  Negative for chest pain, palpitations and leg swelling.  Gastrointestinal:  Negative for abdominal pain, diarrhea, nausea and vomiting.  Endocrine: Negative.   Genitourinary: Negative.  Negative for difficulty urinating.  Musculoskeletal:  Positive for arthralgias. Negative for myalgias.  Skin: Negative.   Neurological: Negative.   Hematological:  Negative for adenopathy. Does not bruise/bleed easily.  Psychiatric/Behavioral: Negative.      Objective:  BP 118/78 (BP Location: Right Arm, Patient Position: Sitting, Cuff Size: Normal)   Pulse 75   Temp 98.4 F (36.9 C) (Oral)   Ht 4' 11.5" (1.511 m)   Wt 119 lb (54 kg)   SpO2 99%   BMI 23.63 kg/m   BP Readings from Last 3 Encounters:  02/22/22 118/78  08/22/21 128/82  05/02/21 136/84    Wt Readings from Last 3 Encounters:  02/22/22 119 lb (54 kg)  08/22/21 119 lb (54 kg)  05/02/21 116 lb (52.6 kg)     Physical Exam Vitals reviewed.  HENT:     Mouth/Throat:     Mouth: Mucous membranes are moist.  Eyes:     General: No scleral icterus.    Conjunctiva/sclera: Conjunctivae normal.  Cardiovascular:     Rate and Rhythm: Normal rate and regular rhythm.     Heart sounds: No murmur heard. Pulmonary:     Effort: Pulmonary effort is normal.     Breath sounds: No stridor. No wheezing, rhonchi or rales.  Abdominal:     General: Abdomen is flat.     Palpations: There is no mass.     Tenderness: There is no abdominal tenderness. There is no guarding.     Hernia: No hernia is present.  Musculoskeletal:        General: Normal range of motion.     Cervical back: Neck supple.     Right lower leg: No edema.     Left lower leg: No edema.  Lymphadenopathy:     Cervical: No cervical adenopathy.  Skin:    General: Skin is warm and dry.  Neurological:     General: No focal deficit present.     Mental Status: She is alert.  Psychiatric:        Mood and Affect: Mood normal.        Behavior: Behavior normal.     Lab Results  Component Value Date   WBC 5.2 02/22/2022  HGB 14.2 02/22/2022   HCT 42.1 02/22/2022   PLT 268.0 02/22/2022   GLUCOSE 90 02/22/2022   CHOL 311 (H) 02/22/2022   TRIG 156.0 (H) 02/22/2022   HDL 55.30 02/22/2022   LDLCALC 224 (H) 02/22/2022   ALT 12 02/22/2022   AST 17 02/22/2022   NA 135 02/22/2022   K 4.2 02/22/2022   CL 99 02/22/2022   CREATININE 1.12 02/22/2022   BUN 22 02/22/2022   CO2 28 02/22/2022   TSH 1.77 02/22/2022   INR 1.0 05/06/2020   HGBA1C 5.7 11/15/2020    MR Shoulder Right Wo Contrast  Result Date: 05/05/2021 CLINICAL DATA:  Right shoulder pain with weakness and limited range of motion for 3 months. Clinical concern for adhesive capsulitis. EXAM: MRI OF THE RIGHT SHOULDER WITHOUT CONTRAST TECHNIQUE: Multiplanar, multisequence MR imaging of the shoulder was performed. No intravenous contrast was administered. COMPARISON:  X-ray shoulder  02/15/2021. FINDINGS: Rotator cuff: Mild tendinosis of the supraspinatus and infraspinatus tendon with fraying along the bursal surface. Teres minor tendon is intact. Subscapularis tendon is intact. Muscles: No muscle atrophy or edema. No intramuscular fluid collection or hematoma. Biceps Long Head: Mild tendinosis of the intra-articular portion of the long head of the biceps tendon. Acromioclavicular Joint: Moderate arthropathy of the acromioclavicular joint. No subacromial/subdeltoid bursal fluid. Glenohumeral Joint: No joint effusion. No chondral defect. Thickening of the inferior joint capsule as can be seen with adhesive capsulitis. Labrum: Grossly intact, but evaluation is limited by lack of intraarticular fluid/contrast. Bones: No fracture or dislocation. No aggressive osseous lesion. Other: No fluid collection or hematoma. IMPRESSION: 1. Mild tendinosis of the supraspinatus and infraspinatus tendon with fraying along the bursal surface. 2. Mild tendinosis of the intra-articular portion of the long head of the biceps tendon. 3. Thickening of the inferior joint capsule as can be seen with adhesive capsulitis. Electronically Signed   By: Elige Ko M.D.   On: 05/05/2021 13:32    Assessment & Plan:   Monica Lara was seen today for annual exam and hyperlipidemia.  Diagnoses and all orders for this visit:  Essential hypertension- Her blood pressure is well-controlled. -     CBC with Differential/Platelet; Future -     Hepatic function panel; Future -     TSH; Future -     Cancel: Urinalysis, Routine w reflex microscopic; Future -     Basic metabolic panel; Future -     Basic metabolic panel -     TSH -     Hepatic function panel -     CBC with Differential/Platelet  Fatty liver- Her LFTs are normal. -     Hepatic function panel; Future -     Hepatic function panel  Stage 3b chronic kidney disease (HCC)- Her renal function has declined slightly.  Will avoid nephrotoxic agents. -     Cancel:  Urinalysis, Routine w reflex microscopic; Future -     Basic metabolic panel; Future -     Basic metabolic panel  Hyperlipidemia LDL goal <160- She is willing to take a statin but no other LDL lowering agents. -     Lipid panel; Future -     TSH; Future -     TSH -     Lipid panel  Estrogen deficiency -     DG Bone Density; Future  Heterozygous familial hypercholesterolemia -     pravastatin (PRAVACHOL) 40 MG tablet; Take 1 tablet (40 mg total) by mouth daily. -     Discontinue:  Bempedoic Acid (NEXLETOL) 180 MG TABS; Take 1 tablet by mouth daily.  Ascending aortic aneurysm, unspecified whether ruptured [facility])- Will reevaluate this. -     CT ANGIO CHEST AORTA W/ & OR WO/CM & GATING ([place] ONLY); Future  Encounter for general adult medical examination with abnormal findings- Exam completed, labs reviewed, vaccines reviewed and updated, cancer screenings are up-to-date, patient education was given.   I am having Monica Lara start on pravastatin. I am also having her maintain her telmisartan and spironolactone.  Meds ordered this encounter  Medications   pravastatin (PRAVACHOL) 40 MG tablet    Sig: Take 1 tablet (40 mg total) by mouth daily.    Dispense:  90 tablet    Refill:  0   DISCONTD: Bempedoic Acid (NEXLETOL) 180 MG TABS    Sig: Take 1 tablet by mouth daily.    Dispense:  90 tablet    Refill:  0     Follow-up: No follow-ups on file.  Sanda Linger, MD

## 2022-02-23 ENCOUNTER — Other Ambulatory Visit: Payer: Self-pay | Admitting: Internal Medicine

## 2022-02-23 ENCOUNTER — Encounter: Payer: Self-pay | Admitting: Internal Medicine

## 2022-02-23 MED ORDER — PRAVASTATIN SODIUM 40 MG PO TABS: 40.0000 mg | ORAL_TABLET | Freq: Every day | ORAL | 0 refills | Status: DC

## 2022-02-23 MED ORDER — NEXLETOL 180 MG PO TABS: 1.0000 | ORAL_TABLET | Freq: Every day | ORAL | 0 refills | Status: DC

## 2022-03-24 ENCOUNTER — Encounter (HOSPITAL_COMMUNITY): Payer: Self-pay

## 2022-03-24 ENCOUNTER — Ambulatory Visit (HOSPITAL_COMMUNITY): Payer: Medicare PPO

## 2022-03-28 ENCOUNTER — Encounter: Payer: Self-pay | Admitting: Internal Medicine

## 2022-04-03 ENCOUNTER — Other Ambulatory Visit: Payer: Self-pay | Admitting: Internal Medicine

## 2022-04-03 DIAGNOSIS — I7121 Aneurysm of the ascending aorta, without rupture: Secondary | ICD-10-CM

## 2022-04-05 NOTE — Telephone Encounter (Signed)
This has been updated with insurance new valid dates are 04/13/22 to 05/13/2022.

## 2022-04-13 ENCOUNTER — Ambulatory Visit (HOSPITAL_COMMUNITY)
Admission: RE | Admit: 2022-04-13 | Discharge: 2022-04-13 | Disposition: A | Discharge status: Home / Self Care | Payer: Medicare PPO | Source: Ambulatory Visit | Attending: Internal Medicine | Admitting: Internal Medicine

## 2022-04-13 DIAGNOSIS — I7121 Aneurysm of the ascending aorta, without rupture: Secondary | ICD-10-CM | POA: Insufficient documentation

## 2022-04-13 MED ORDER — IOHEXOL 350 MG/ML SOLN
75.0000 mL | Freq: Once | INTRAVENOUS | Status: AC | PRN
  Administered 2022-04-13: 75 mL via INTRAVENOUS

## 2022-04-17 ENCOUNTER — Encounter: Payer: Self-pay | Admitting: Internal Medicine

## 2022-04-23 ENCOUNTER — Other Ambulatory Visit: Payer: Self-pay | Admitting: Internal Medicine

## 2022-04-23 DIAGNOSIS — I2510 Atherosclerotic heart disease of native coronary artery without angina pectoris: Secondary | ICD-10-CM | POA: Insufficient documentation

## 2022-04-26 ENCOUNTER — Other Ambulatory Visit: Payer: Self-pay | Admitting: Internal Medicine

## 2022-04-26 DIAGNOSIS — E876 Hypokalemia: Secondary | ICD-10-CM

## 2022-04-26 DIAGNOSIS — I10 Essential (primary) hypertension: Secondary | ICD-10-CM

## 2022-05-05 ENCOUNTER — Ambulatory Visit: Payer: Medicare PPO | Attending: Cardiovascular Disease | Admitting: Cardiovascular Disease

## 2022-05-05 ENCOUNTER — Encounter: Payer: Self-pay | Admitting: Cardiovascular Disease

## 2022-05-05 VITALS — BP 120/74 | HR 84 | Ht 60.0 in | Wt 119.6 lb

## 2022-05-05 DIAGNOSIS — E7801 Familial hypercholesterolemia: Secondary | ICD-10-CM | POA: Diagnosis not present

## 2022-05-05 DIAGNOSIS — I10 Essential (primary) hypertension: Secondary | ICD-10-CM

## 2022-05-05 DIAGNOSIS — I150 Renovascular hypertension: Secondary | ICD-10-CM | POA: Diagnosis not present

## 2022-05-05 DIAGNOSIS — I2510 Atherosclerotic heart disease of native coronary artery without angina pectoris: Secondary | ICD-10-CM | POA: Diagnosis not present

## 2022-05-05 DIAGNOSIS — I701 Atherosclerosis of renal artery: Secondary | ICD-10-CM | POA: Diagnosis not present

## 2022-05-05 DIAGNOSIS — I2584 Coronary atherosclerosis due to calcified coronary lesion: Secondary | ICD-10-CM

## 2022-05-05 DIAGNOSIS — E785 Hyperlipidemia, unspecified: Secondary | ICD-10-CM

## 2022-05-05 DIAGNOSIS — I700 Atherosclerosis of aorta: Secondary | ICD-10-CM | POA: Diagnosis not present

## 2022-05-05 NOTE — Patient Instructions (Signed)
Medication Instructions:  No changes *If you need a refill on your cardiac medications before your next appointment, please call your pharmacy*  Testing/Procedures: Dr Sallyanne Kuster has ordered a CT coronary calcium score.   Test locations:  [place]   This is $99 out of pocket.   Coronary CalciumScan A coronary calcium scan is an imaging test used to look for deposits of calcium and other fatty materials (plaques) in the inner lining of the blood vessels of the heart (coronary arteries). These deposits of calcium and plaques can partly clog and narrow the coronary arteries without producing any symptoms or warning signs. This puts a person at risk for a heart attack. This test can detect these deposits before symptoms develop. Tell a health care provider about: Any allergies you have. All medicines you are taking, including vitamins, herbs, eye drops, creams, and over-the-counter medicines. Any problems you or family members have had with anesthetic medicines. Any blood disorders you have. Any surgeries you have had. Any medical conditions you have. Whether you are pregnant or may be pregnant. What are the risks? Generally, this is a safe procedure. However, problems may occur, including: Harm to a pregnant woman and her unborn baby. This test involves the use of radiation. Radiation exposure can be dangerous to a pregnant woman and her unborn baby. If you are pregnant, you generally should not have this procedure done. Slight increase in the risk of cancer. This is because of the radiation involved in the test. What happens before the procedure? No preparation is needed for this procedure. What happens during the procedure? You will undress and remove any jewelry around your neck or chest. You will put on a hospital gown. Sticky electrodes will be placed on your chest. The electrodes will be connected to an electrocardiogram (ECG) machine to record a  tracing of the electrical activity of your heart. A CT scanner will take pictures of your heart. During this time, you will be asked to lie still and hold your breath for 2-3 seconds while a picture of your heart is being taken. The procedure may vary among health care providers and hospitals. What happens after the procedure? You can get dressed. You can return to your normal activities. It is up to you to get the results of your test. Ask your health care provider, or the department that is doing the test, when your results will be ready. Summary A coronary calcium scan is an imaging test used to look for deposits of calcium and other fatty materials (plaques) in the inner lining of the blood vessels of the heart (coronary arteries). Generally, this is a safe procedure. Tell your health care provider if you are pregnant or may be pregnant. No preparation is needed for this procedure. A CT scanner will take pictures of your heart. You can return to your normal activities after the scan is done. This information is not intended to replace advice given to you by your health care provider. Make sure you discuss any questions you have with your health care provider. Document Released: 08/12/2007 Document Revised: 01/03/2016 Document Reviewed: 01/03/2016 Elsevier Interactive Patient Education  2017 [place]: At [facility], you and your health needs are our priority.  As part of our continuing mission to provide you with exceptional heart care, we have created designated Provider Care Teams.  These Care Teams include your primary Cardiologist (physician) and Advanced Practice Providers (APPs -  Physician Assistants and  Nurse Practitioners) who all work together to provide you with the care you need, when you need it.  We recommend signing up for the patient portal called "MyChart".  Sign up information is provided on this After Visit Summary.  MyChart is used to connect  with patients for Virtual Visits (Telemedicine).  Patients are able to view lab/test results, encounter notes, upcoming appointments, etc.  Non-urgent messages can be sent to your provider as well.   To learn more about what you can do with MyChart, go to [email or web address].    Your next appointment:   1 year(s)  Provider:   Dr Sallyanne Kuster

## 2022-05-06 ENCOUNTER — Encounter: Payer: Self-pay | Admitting: Cardiovascular Disease

## 2022-05-06 NOTE — Progress Notes (Signed)
Cardiology Office Note:    Date:  05/06/2022   ID:  Monica Lara, DOB Aug 04, 1950, MRN [id number]  PCP:  Janith Lima, MD   [place] Providers Cardiologist:  None     Referring MD: Janith Lima, MD   Chief Complaint  Patient presents with   Consult  Monica Lara is a 72 y.o. female who is being seen today for the evaluation of hypercholesterolemia at the request of Janith Lima, MD.   History of Present Illness:    Monica Lara is a 72 y.o. female with a hx of hypertension, hypercholesterolemia and incidentally found coronary and aortic atherosclerosis.  She has a history of severe myopathy with rosuvastatin and atorvastatin and reports developing side effects (gout attacks) when she was taking Zetia.  She is currently on treatment with pravastatin 40 mg daily.  Untreated, her total cholesterol is 211, HDL 55 and LDL 224 with borderline triglycerides of 156 and borderline hemoglobin A1c 5.7%.  She has not had clinically significant CAD or PAD.  There is no history of anginal, stroke/TIA or intermittent claudication.  In March 2022 she had severe abdominal pain and was suspected of dissection of the aorta.  CT imaging showed that the symptoms were related to a large hiatal hernia with gastric volvulus. CT did not show evidence of aortic aneurysm or dissection, but there was widespread atherosclerosis including a "high-grade stenosis of the left renal artery" and "the left kidney is atrophic".  Coronary and aortic atherosclerotic calcifications were also mentioned.  Subsequent CT performed in April 2022 described the ascending thoracic aorta is ectatic with a maximum diameter 3.9 cm (in March the aorta was described as normal caliber).  She had a follow-up CT angiogram of the chest in February 2024 that measured the aortic root and the ascending aorta at a maximum of 3.8 cm, once again describing atherosclerosis in the aorta and its great branches and in the coronary arteries.   The left kidney was described as exhibiting "chronic mild atrophy"  The patient specifically denies any chest pain at rest exertion, dyspnea at rest or with exertion, orthopnea, paroxysmal nocturnal dyspnea, syncope, palpitations, focal neurological deficits, intermittent claudication, lower extremity edema, unexplained weight gain, cough, hemoptysis or wheezing.   She does not smoke and does not have full-blown diabetes mellitus.  She is quite lean.  There is no family history of early onset coronary or peripheral arterial disease.  Her father died at age 74 of congestive heart failure but it sounds from her description that this was due to the consequences of rheumatic valvular disease.  Past Medical History:  Diagnosis Date   Hypertension     Past Surgical History:  Procedure Laterality Date   DILATION AND CURETTAGE OF UTERUS     ESOPHAGOGASTRODUODENOSCOPY (EGD) WITH PROPOFOL N/A 05/04/2020   Procedure: ESOPHAGOGASTRODUODENOSCOPY (EGD) WITH PROPOFOL;  Surgeon: Mauri Pole, MD;  Location: [facility];  Service: Endoscopy;  Laterality: N/A;   HIATAL HERNIA REPAIR N/A 05/06/2020   Procedure: LAPAROSCOPIC REPAIR OF HIATAL HERNIA;  Surgeon: Johnathan Hausen, MD;  Location: [facility];  Service: General;  Laterality: N/A;   LAPAROSCOPIC NISSEN FUNDOPLICATION N/A [id number]   Procedure: POSSIBLE LAPAROSCOPIC NISSEN FUNDOPLICATION;  Surgeon: Johnathan Hausen, MD;  Location: [facility];  Service: General;  Laterality: N/A;    Current Medications: Current Meds  Medication Sig   pravastatin (PRAVACHOL) 40 MG tablet Take 1 tablet (40 mg total) by mouth daily.   spironolactone (ALDACTONE) 25 MG tablet TAKE 1  TABLET (25 MG TOTAL) BY MOUTH DAILY.   telmisartan (MICARDIS) 40 MG tablet TAKE 1 TABLET BY MOUTH EVERY DAY     Allergies:   3-methyl-2-benzothiazolinone hydrazone, Ezetimibe, Sulfa antibiotics, and Statins   Social History   Socioeconomic History   Marital status: Single    Spouse name: Not  on file   Number of children: Not on file   Years of education: Not on file   Highest education level: Not on file  Occupational History   Not on file  Tobacco Use   Smoking status: Never   Smokeless tobacco: Never  Vaping Use   Vaping Use: Never used  Substance and Sexual Activity   Alcohol use: Never   Drug use: Never   Sexual activity: Not on file  Other Topics Concern   Not on file  Social History Narrative   Not on file   Social Determinants of Health   Financial Resource Strain: Not on file  Food Insecurity: Not on file  Transportation Needs: Not on file  Physical Activity: Not on file  Stress: Not on file  Social Connections: Not on file     Family History: The patient's family history includes Congestive Heart Failure in her father; Hypertension in her father.  ROS:   Please see the history of present illness.     All other systems reviewed and are negative.  EKGs/Labs/Other Studies Reviewed:    The following studies were reviewed today: CT angiogram from March 2022 and April 2022 and February 2024.  EKG:  EKG is  ordered today.  The ekg ordered today demonstrates normal sinus rhythm, generalized low voltage, QTc 463 ms.  Recent Labs: 02/22/2022: ALT 12; BUN 22; Creatinine, Ser 1.12; Hemoglobin 14.2; Platelets 268.0; Potassium 4.2; Sodium 135; TSH 1.77  Recent Lipid Panel    Component Value Date/Time   CHOL 311 (H) 02/22/2022 1054   TRIG 156.0 (H) 02/22/2022 1054   HDL 55.30 02/22/2022 1054   CHOLHDL 6 02/22/2022 1054   VLDL 31.2 02/22/2022 1054   LDLCALC 224 (H) 02/22/2022 1054     Risk Assessment/Calculations:                Physical Exam:    VS:  BP 120/74 (BP Location: Left Arm, Patient Position: Sitting)   Pulse 84   Ht 5' (1.524 m)   Wt 119 lb 9.6 oz (54.3 kg)   SpO2 98%   BMI 23.36 kg/m     Wt Readings from Last 3 Encounters:  05/05/22 119 lb 9.6 oz (54.3 kg)  02/22/22 119 lb (54 kg)  08/22/21 119 lb (54 kg)     GEN:  Appears petite and lean.  Well nourished, well developed in no acute distress HEENT: Normal NECK: No JVD; No carotid bruits LYMPHATICS: No lymphadenopathy CARDIAC: RRR, no murmurs, rubs, gallops RESPIRATORY:  Clear to auscultation without rales, wheezing or rhonchi  ABDOMEN: Soft, non-tender, non-distended MUSCULOSKELETAL:  No edema; No deformity  SKIN: Warm and dry NEUROLOGIC:  Alert and oriented x 3 PSYCHIATRIC:  Normal affect   ASSESSMENT:    1. Heterozygous familial hypercholesterolemia   2. Coronary atherosclerosis due to calcified coronary lesion   3. Renovascular hypertension   4. Atherosclerosis of aorta ([place])   5. Left renal artery stenosis ([facility])    PLAN:    In order of problems listed above:  Hypercholesterolemia: I think Monica Lara has familial heterozygous hypercholesterolemia.  She has evidence of significant atherosclerotic plaque in the aorta and its branches including  what appears to be renal artery stenosis with secondary atrophy of the left kidney.  Target LDL cholesterol is less than 70 or at least a 50% decrease from baseline.  We will not be able to achieve this with pravastatin in my opinion.  She has proven to be intolerant to more potent statins and also could not take Zetia.  She is very reluctant to consider PCSK9 inhibitors due to the need to self administer injections.  I told her that is an option to use a monthly Pushtronex cartridge instead.  We explored Leqvio, but this would be prohibitively expensive with her current insurance coverage.  Nexletol would be insufficiently powerful to correct her severe hypercholesterolemia.  I had her sit down with our clinical pharmacist to discuss the option of the Pushtronex cartridge, but for the time being Monica Lara prefers to just recheck her lipid profile on pravastatin before making a decision. CAD: She does not have any symptoms of coronary artery disease.  I suggested we quantify the severity of coronary  atherosclerosis with a calcium score test.  If the calcium score is high, hopefully this will convince Monica Lara of the need for more aggressive lipid-lowering therapy.  If the calcium score is low or average, we can continue with pravastatin. Aortic atherosclerosis: Borderline high ascending aortic diameter 3.8 cm, stable over the last 2 years.  I do not think yearly imaging is indicated. Left renal artery stenosis: Overall renal function is preserved and blood pressure is well-controlled.  Revascularization does not appear to be indicated HTN: It appears likely that she has renovascular hypertension.  She has evidence of left renal artery stenosis and at least mild atrophy of the left kidney.  Overall kidney function is only mild-moderately reduced with a recent creatinine of 1.1 corresponding to a GFR of around 50.  The current choice of antihypertensive therapy with ARB and spironolactone is optimal considering the mechanism of her high blood pressure. 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  Concerns regarding medicines are outlined above.  Orders Placed This Encounter  Procedures   CT CARDIAC SCORING (SELF PAY ONLY)   EKG 12-Lead   No orders of the defined types were placed in this encounter.   Patient Instructions  Medication Instructions:  No changes *If you need a refill on your cardiac medications before your next appointment, please call your pharmacy*  Testing/Procedures: Dr Sallyanne Kuster has ordered a CT coronary calcium score.   Test locations:  [place]   This is $99 out of pocket.   Coronary CalciumScan A coronary calcium scan is an imaging test used to look for deposits of calcium and other fatty materials (plaques) in the inner lining of the blood vessels of the  heart (coronary arteries). These deposits of calcium and plaques can partly clog and narrow the coronary arteries without producing any symptoms or warning signs. This puts a person at risk for a heart attack. This test can detect these deposits before symptoms develop. Tell a health care provider about: Any allergies you have. All medicines you are taking, including vitamins, herbs, eye drops, creams, and over-the-counter medicines. Any problems you or family members have had with anesthetic medicines. Any blood disorders you have. Any surgeries you have had. Any medical conditions you have. Whether you are pregnant or  may be pregnant. What are the risks? Generally, this is a safe procedure. However, problems may occur, including: Harm to a pregnant woman and her unborn baby. This test involves the use of radiation. Radiation exposure can be dangerous to a pregnant woman and her unborn baby. If you are pregnant, you generally should not have this procedure done. Slight increase in the risk of cancer. This is because of the radiation involved in the test. What happens before the procedure? No preparation is needed for this procedure. What happens during the procedure? You will undress and remove any jewelry around your neck or chest. You will put on a hospital gown. Sticky electrodes will be placed on your chest. The electrodes will be connected to an electrocardiogram (ECG) machine to record a tracing of the electrical activity of your heart. A CT scanner will take pictures of your heart. During this time, you will be asked to lie still and hold your breath for 2-3 seconds while a picture of your heart is being taken. The procedure may vary among health care providers and hospitals. What happens after the procedure? You can get dressed. You can return to your normal activities. It is up to you to get the results of your test. Ask your health care provider, or the department that is doing the  test, when your results will be ready. Summary A coronary calcium scan is an imaging test used to look for deposits of calcium and other fatty materials (plaques) in the inner lining of the blood vessels of the heart (coronary arteries). Generally, this is a safe procedure. Tell your health care provider if you are pregnant or may be pregnant. No preparation is needed for this procedure. A CT scanner will take pictures of your heart. You can return to your normal activities after the scan is done. This information is not intended to replace advice given to you by your health care provider. Make sure you discuss any questions you have with your health care provider. Document Released: 08/12/2007 Document Revised: 01/03/2016 Document Reviewed: 01/03/2016 Elsevier Interactive Patient Education  2017 [place]: At [facility], you and your health needs are our priority.  As part of our continuing mission to provide you with exceptional heart care, we have created designated Provider Care Teams.  These Care Teams include your primary Cardiologist (physician) and Advanced Practice Providers (APPs -  Physician Assistants and Nurse Practitioners) who all work together to provide you with the care you need, when you need it.  We recommend signing up for the patient portal called "MyChart".  Sign up information is provided on this After Visit Summary.  MyChart is used to connect with patients for Virtual Visits (Telemedicine).  Patients are able to view lab/test results, encounter notes, upcoming appointments, etc.  Non-urgent messages can be sent to your provider as well.   To learn more about what you can do with MyChart, go to [email or web address].    Your next appointment:   1 year(s)  Provider:   Dr Sallyanne Kuster    Signed, Sanda Klein, MD  05/06/2022 3:47 PM    [place]

## 2022-05-10 ENCOUNTER — Emergency Department (HOSPITAL_BASED_OUTPATIENT_CLINIC_OR_DEPARTMENT_OTHER)
Admission: EM | Admit: 2022-05-10 | Discharge: 2022-05-10 | Disposition: A | Discharge status: Home / Self Care | Payer: Medicare PPO | Attending: Emergency Medicine | Admitting: Emergency Medicine

## 2022-05-10 ENCOUNTER — Encounter (HOSPITAL_BASED_OUTPATIENT_CLINIC_OR_DEPARTMENT_OTHER): Payer: Self-pay | Admitting: Emergency Medicine

## 2022-05-10 ENCOUNTER — Emergency Department (HOSPITAL_BASED_OUTPATIENT_CLINIC_OR_DEPARTMENT_OTHER): Payer: Medicare PPO

## 2022-05-10 ENCOUNTER — Other Ambulatory Visit: Payer: Self-pay

## 2022-05-10 DIAGNOSIS — M79605 Pain in left leg: Secondary | ICD-10-CM | POA: Diagnosis not present

## 2022-05-10 DIAGNOSIS — M79662 Pain in left lower leg: Secondary | ICD-10-CM | POA: Diagnosis not present

## 2022-05-10 NOTE — ED Notes (Signed)
Discharge paperwork reviewed entirely with patient, including Rx's and follow up care. Pain was under control. Pt verbalized understanding as well as all parties involved. No questions or concerns voiced at the time of discharge. No acute distress noted.   Pt ambulated out to PVA without incident or assistance.  

## 2022-05-10 NOTE — ED Provider Notes (Signed)
[facility] Provider Note   CSN: [id number] Arrival date & time: 05/10/22  1741     History  Chief Complaint  Patient presents with   Leg Pain    Monica Lara is a 72 y.o. female with past medical history of DVT/PE who presents to the ED complaining of left calf pain over the last few days that is now extended to the left side.  She states that she was on anticoagulants for a few months but these were discontinued by her provider.  She denies chest pain, shortness of breath, palpitations, lightheadedness, dizziness, syncope, or any other complaints at this time.  States that she only came to the ED to make sure she does not have a blood clot in her leg.  She denies any injury to the leg or fall.  She denies any recent increased activity.      Home Medications Prior to Admission medications   Medication Sig Start Date End Date Taking? Authorizing Provider  pravastatin (PRAVACHOL) 40 MG tablet Take 1 tablet (40 mg total) by mouth daily. 02/23/22   Janith Lima, MD  spironolactone (ALDACTONE) 25 MG tablet TAKE 1 TABLET (25 MG TOTAL) BY MOUTH DAILY. 04/26/22   Janith Lima, MD  telmisartan (MICARDIS) 40 MG tablet TAKE 1 TABLET BY MOUTH EVERY DAY 04/26/22   Janith Lima, MD      Allergies    3-methyl-2-benzothiazolinone hydrazone, Ezetimibe, Sulfa antibiotics, and Statins    Review of Systems   Review of Systems  All other systems reviewed and are negative.   Physical Exam Updated Vital Signs BP (!) 148/89   Pulse 93   Temp 98.3 F (36.8 C) (Oral)   Resp 18   Ht 5' (1.524 m)   Wt 54 kg   SpO2 100%   BMI 23.24 kg/m  Physical Exam Vitals and nursing note reviewed.  Constitutional:      General: She is not in acute distress.    Appearance: Normal appearance.  HENT:     Head: Normocephalic and atraumatic.     Mouth/Throat:     Mouth: Mucous membranes are moist.  Eyes:     Conjunctiva/sclera: Conjunctivae normal.   Cardiovascular:     Rate and Rhythm: Normal rate and regular rhythm.     Heart sounds: No murmur heard. Pulmonary:     Effort: Pulmonary effort is normal. No respiratory distress.     Breath sounds: Normal breath sounds. No stridor. No wheezing, rhonchi or rales.  Abdominal:     General: Abdomen is flat.     Palpations: Abdomen is soft.     Tenderness: There is no abdominal tenderness.  Musculoskeletal:        General: Normal range of motion.     Cervical back: Normal range of motion and neck supple.     Right lower leg: No edema.     Left lower leg: No edema.     Comments: No lower extremity tenderness bilaterally, neurovascularly intact, normal gait  Skin:    General: Skin is warm and dry.     Capillary Refill: Capillary refill takes less than 2 seconds.  Neurological:     Mental Status: She is alert. Mental status is at baseline.  Psychiatric:        Behavior: Behavior normal.     ED Results / Procedures / Treatments   Labs (all labs ordered are listed, but only abnormal results are displayed) Labs Reviewed -  No data to display  EKG None  Radiology US Venous Img Lower Unilateral Left  Result Date: 05/10/2022 CLINICAL DATA:  Calf pain EXAM: LEFT LOWER EXTREMITY VENOUS DOPPLER ULTRASOUND TECHNIQUE: Gray-scale sonography with compression, as well as color and duplex ultrasound, were performed to evaluate the deep venous system(s) from the level of the common femoral vein through the popliteal and proximal calf veins. COMPARISON:  None Available. FINDINGS: VENOUS Normal compressibility of the common femoral, superficial femoral, and popliteal veins, as well as the visualized calf veins. Visualized portions of profunda femoral vein and great saphenous vein unremarkable. No filling defects to suggest DVT on grayscale or color Doppler imaging. Doppler waveforms show normal direction of venous flow, normal respiratory plasticity and response to augmentation. Limited views of the  contralateral common femoral vein are unremarkable. OTHER None. Limitations: none IMPRESSION: Negative. Electronically Signed   By: Ronney Asters M.D.   On: 05/10/2022 18:32    Procedures Procedures    Medications Ordered in ED Medications - No data to display  ED Course/ Medical Decision Making/ A&P                             Medical Decision Making Amount and/or Complexity of Data Reviewed Radiology: ordered. Decision-making details documented in ED Course.   Medical Decision Making:   Monica Lara is a 72 y.o. female who presented to the ED today with left leg pain detailed above.    Patient's presentation is complicated by their history of previous DVT/PE.  Complete initial physical exam performed, notably the patient was in no acute distress with a normal heart and lung exam.  Slightly hypertensive but vital signs reassuring with normal heart rate, normal respirations, and 100% oxygen saturation on room air.  Patient has no lower extremity edema or tenderness.  She is neurovascularly intact distally.  She has a steady, stable gait.  No overlying skin changes to the lower extremities and compartments are soft.  Both of them are warm and appear well-perfused. Reviewed and confirmed nursing documentation for past medical history, family history, social history.    Initial Assessment:   With the patient's presentation of leg pain, most likely diagnosis is DVT. Differential diagnosis includes but is not limited to strain, sprain, fracture, dislocation, compartment syndrome, PE.  This is most consistent with an acute complicated illness  Initial Plan:  Ultrasound to rule out DVT Objective evaluation as reviewed   Initial Study Results:   Radiology:  All images reviewed independently. Agree with radiology report at this time.   US Venous Img Lower Unilateral Left  Result Date: 05/10/2022 CLINICAL DATA:  Calf pain EXAM: LEFT LOWER EXTREMITY VENOUS DOPPLER ULTRASOUND TECHNIQUE:  Gray-scale sonography with compression, as well as color and duplex ultrasound, were performed to evaluate the deep venous system(s) from the level of the common femoral vein through the popliteal and proximal calf veins. COMPARISON:  None Available. FINDINGS: VENOUS Normal compressibility of the common femoral, superficial femoral, and popliteal veins, as well as the visualized calf veins. Visualized portions of profunda femoral vein and great saphenous vein unremarkable. No filling defects to suggest DVT on grayscale or color Doppler imaging. Doppler waveforms show normal direction of venous flow, normal respiratory plasticity and response to augmentation. Limited views of the contralateral common femoral vein are unremarkable. OTHER None. Limitations: none IMPRESSION: Negative. Electronically Signed   By: Ronney Asters M.D.   On: 05/10/2022 18:32   CT ANGIO CHEST  AORTA W/ & OR WO/CM & GATING ([place] ONLY)  Result Date: 04/14/2022 CLINICAL DATA:  Ascending aortic aneurysm, unspecified whether rupture. Follow-up thoracic aortic aneurysm. EXAM: CT ANGIOGRAPHY CHEST WITH CONTRAST TECHNIQUE: Multidetector CT imaging of the chest was performed using the standard protocol during bolus administration of intravenous contrast. Multiplanar CT image reconstructions and MIPs were obtained to evaluate the vascular anatomy. RADIATION DOSE REDUCTION: This exam was performed according to the departmental dose-optimization program which includes automated exposure control, adjustment of the mA and/or kV according to patient size and/or use of iterative reconstruction technique. CONTRAST:  16m OMNIPAQUE IOHEXOL 350 MG/ML SOLN COMPARISON:  Chest CTA 06/03/2020 and CTA 05/03/2020 FINDINGS: Cardiovascular: Coronary artery calcifications involving the LAD, left circumflex and right coronary artery. Aortic root at the sinuses of Valsalva measures approximately 3.8 cm. Ascending thoracic aorta measures 3.8 cm and stable. No  evidence for intramural hematoma or dissection. Typical three-vessel arch anatomy. The great vessels are patent. Proximal vertebral arteries are patent bilaterally. Proximal descending thoracic aorta measures 2.4 cm. Mild atherosclerotic disease in the descending thoracic aorta with atherosclerotic wall calcifications. Celiac trunk is widely patent. Proximal SMA is patent. Visualized renal arteries are patent. Main pulmonary arteries are patent. Mediastinum/Nodes: Thyroid tissue is unremarkable. No mediastinal, hilar or axillary lymph node enlargement. Esophagus is unremarkable. Lungs/Pleura: Trachea and mainstem bronchi are patent. No pleural effusions. 3 mm focal nodular density in the right middle lobe on image 91/5 is stable since 06/03/2020. No significant airspace disease or consolidation in the lungs. Upper Abdomen: Chronic mild atrophy in the left kidney. Large right renal cyst measuring up to 5.9 cm. Mild fullness in the left kidney upper pole on image 126/9 appears stable from 2022 and probably related to the collecting system or medullary pyramid. Additional renal cysts that do not require dedicated follow-up. No acute abnormality in the visualized upper abdomen. Surgical changes at the GE junction. Musculoskeletal: No acute bone abnormality. Review of the MIP images confirms the above findings. IMPRESSION: 1. Stable ascending thoracic aorta measuring up to 3.8 cm. 2. Aortic Atherosclerosis (ICD10-I70.0). Coronary artery calcifications. 3. No acute chest abnormality. Electronically Signed   By: AMarkus DaftM.D.   On: 04/14/2022 13:52    Final Assessment and Plan:   This is a 72year-old female presenting to the ED with left lower extremity pain.  She has a history of DVT/PE a couple years ago.  She was anticoagulated for a few months following this but is no longer on anticoagulants.  She has not had any recent travel, recent surgeries, hormone use, or other identifiable risk factors for recurrent  DVT/PE.  She denies any associated chest pain, shortness of breath, palpitations, or any other signs/symptoms.  Vital signs unremarkable.  Exam reassuring and benign.  Ultrasound negative for DVT.  Patient updated on all findings.  Strict ED return precautions given, all questions answered, and stable for discharge.   Clinical Impression:  1. Left leg pain      Discharge           Final Clinical Impression(s) / ED Diagnoses Final diagnoses:  Left leg pain    Rx / DC Orders ED Discharge Orders     None         GSuzzette Righter PA-C 05/10/22 1951    BMalvin Johns MD 05/10/22 [phone]

## 2022-05-10 NOTE — ED Triage Notes (Signed)
Pt with LLE pain; started in calf, now radiates to posterior thigh; hx of DVT, PE

## 2022-05-10 NOTE — Discharge Instructions (Signed)
Your ultrasound was negative for DVT.  Your vital signs and physical exam are reassuring.  Please follow-up with your PCP as needed.  For any new or worsening symptoms, please return to the nearest emergency department for reevaluation.

## 2022-05-16 ENCOUNTER — Encounter: Payer: Self-pay | Admitting: Internal Medicine

## 2022-05-16 ENCOUNTER — Ambulatory Visit (HOSPITAL_BASED_OUTPATIENT_CLINIC_OR_DEPARTMENT_OTHER)
Admission: RE | Admit: 2022-05-16 | Discharge: 2022-05-16 | Disposition: A | Discharge status: Home / Self Care | Payer: Medicare PPO | Source: Ambulatory Visit | Attending: Cardiovascular Disease | Admitting: Cardiovascular Disease

## 2022-05-16 ENCOUNTER — Encounter: Payer: Self-pay | Admitting: Cardiovascular Disease

## 2022-05-16 DIAGNOSIS — I2584 Coronary atherosclerosis due to calcified coronary lesion: Secondary | ICD-10-CM | POA: Insufficient documentation

## 2022-05-16 DIAGNOSIS — I2510 Atherosclerotic heart disease of native coronary artery without angina pectoris: Secondary | ICD-10-CM | POA: Insufficient documentation

## 2022-05-16 DIAGNOSIS — E7801 Familial hypercholesterolemia: Secondary | ICD-10-CM

## 2022-05-17 ENCOUNTER — Telehealth: Payer: Self-pay

## 2022-05-17 ENCOUNTER — Other Ambulatory Visit: Payer: Self-pay | Admitting: Internal Medicine

## 2022-05-17 NOTE — Telephone Encounter (Signed)
Called patient to schedule Medicare Annual Wellness Visit (AWV). Unable to reach patient.  Last date of AWV: eligible for AWV-I as of 07/28/17  Please schedule an appointment at any time with [facility].   Norton Blizzard, [place] ([facility])  [place] [facility] [phone]

## 2022-05-18 ENCOUNTER — Telehealth: Payer: Self-pay

## 2022-05-18 NOTE — Telephone Encounter (Signed)
Contacted [name] to schedule their annual wellness visit. Appointment made for 05/23/22.  [place]orton Blizzard, [place] ([facility])  [place] [facility] [phone]

## 2022-05-19 ENCOUNTER — Other Ambulatory Visit: Payer: Self-pay | Admitting: Internal Medicine

## 2022-05-19 DIAGNOSIS — E7801 Familial hypercholesterolemia: Secondary | ICD-10-CM

## 2022-05-21 ENCOUNTER — Other Ambulatory Visit: Payer: Self-pay | Admitting: Internal Medicine

## 2022-05-21 DIAGNOSIS — E7801 Familial hypercholesterolemia: Secondary | ICD-10-CM

## 2022-05-22 ENCOUNTER — Other Ambulatory Visit: Payer: Self-pay | Admitting: Internal Medicine

## 2022-05-22 DIAGNOSIS — E7801 Familial hypercholesterolemia: Secondary | ICD-10-CM

## 2022-05-22 MED ORDER — PRAVASTATIN SODIUM 40 MG PO TABS: 40.0000 mg | ORAL_TABLET | Freq: Every day | ORAL | 0 refills | Status: DC

## 2022-05-23 ENCOUNTER — Ambulatory Visit (INDEPENDENT_AMBULATORY_CARE_PROVIDER_SITE_OTHER): Payer: Medicare PPO

## 2022-05-23 VITALS — Ht 60.0 in | Wt 119.0 lb

## 2022-05-23 DIAGNOSIS — Z0000 Encounter for general adult medical examination without abnormal findings: Secondary | ICD-10-CM

## 2022-05-23 NOTE — Progress Notes (Signed)
Subjective:   Monica Lara is a 72 y.o. female who presents for an Initial Medicare Annual Wellness Visit.  I connected with  Monica Lara on 05/23/22 by a audio enabled telemedicine application and verified that I am speaking with the correct person using two identifiers.  Patient Location: Home  Provider Location: Home Office  I discussed the limitations of evaluation and management by telemedicine. The patient expressed understanding and agreed to proceed.  Review of Systems     Cardiac Risk Factors include: advanced age (>60men, >51 women);hypertension;dyslipidemia     Objective:    Today's Vitals   05/23/22 2101  Weight: 119 lb (54 kg)  Height: 5' (1.524 m)   Body mass index is 23.24 kg/m.     05/23/2022    9:05 PM 05/10/2022    5:48 PM 06/03/2020    3:38 AM 05/06/2020    9:12 AM 05/04/2020   10:47 AM 05/03/2020    4:11 PM  Advanced Directives  Does Patient Have a Medical Advance Directive? No No No No No No  Would patient like information on creating a medical advance directive? No - Patient declined  No - Patient declined No - Patient declined No - Patient declined No - Patient declined    Current Medications (verified) Outpatient Encounter Medications as of 05/23/2022  Medication Sig   pravastatin (PRAVACHOL) 40 MG tablet Take 1 tablet (40 mg total) by mouth daily.   spironolactone (ALDACTONE) 25 MG tablet TAKE 1 TABLET (25 MG TOTAL) BY MOUTH DAILY.   telmisartan (MICARDIS) 40 MG tablet TAKE 1 TABLET BY MOUTH EVERY DAY   [DISCONTINUED] pravastatin (PRAVACHOL) 40 MG tablet Take 1 tablet (40 mg total) by mouth daily.   No facility-administered encounter medications on file as of 05/23/2022.    Allergies (verified) 3-methyl-2-benzothiazolinone hydrazone, Ezetimibe, Sulfa antibiotics, and Statins   History: Past Medical History:  Diagnosis Date   Hypertension    Past Surgical History:  Procedure Laterality Date   DILATION AND CURETTAGE OF UTERUS      ESOPHAGOGASTRODUODENOSCOPY (EGD) WITH PROPOFOL N/A 05/04/2020   Procedure: ESOPHAGOGASTRODUODENOSCOPY (EGD) WITH PROPOFOL;  Surgeon: Mauri Pole, MD;  Location: [facility];  Service: Endoscopy;  Laterality: N/A;   HIATAL HERNIA REPAIR N/A 05/06/2020   Procedure: LAPAROSCOPIC REPAIR OF HIATAL HERNIA;  Surgeon: Johnathan Hausen, MD;  Location: [facility];  Service: General;  Laterality: N/A;   LAPAROSCOPIC NISSEN FUNDOPLICATION N/A [id number]   Procedure: POSSIBLE LAPAROSCOPIC NISSEN FUNDOPLICATION;  Surgeon: Johnathan Hausen, MD;  Location: [facility];  Service: General;  Laterality: N/A;   Family History  Problem Relation Age of Onset   Hypertension Father    Congestive Heart Failure Father    Social History   Socioeconomic History   Marital status: Single    Spouse name: Not on file   Number of children: Not on file   Years of education: Not on file   Highest education level: Not on file  Occupational History   Not on file  Tobacco Use   Smoking status: Never   Smokeless tobacco: Never  Vaping Use   Vaping Use: Never used  Substance and Sexual Activity   Alcohol use: Never   Drug use: Never   Sexual activity: Not on file  Other Topics Concern   Not on file  Social History Narrative   Not on file   Social Determinants of Health   Financial Resource Strain: Low Risk  (05/23/2022)   Overall Financial Resource Strain (CARDIA)    Difficulty  of Paying Living Expenses: Not hard at all  Food Insecurity: No Food Insecurity (05/23/2022)   Hunger Vital Sign    Worried About Running Out of Food in the Last Year: Never true    Ran Out of Food in the Last Year: Never true  Transportation Needs: No Transportation Needs (05/23/2022)   PRAPARE - Hydrologist (Medical): No    Lack of Transportation (Non-Medical): No  Physical Activity: Sufficiently Active (05/23/2022)   Exercise Vital Sign    Days of Exercise per Week: 6 days    Minutes of Exercise per Session:  30 min  Stress: No Stress Concern Present (05/23/2022)   [place]    Feeling of Stress : Not at all  Social Connections: Moderately Integrated (05/23/2022)   Social Connection and Isolation Panel [NHANES]    Frequency of Communication with Friends and Family: More than three times a week    Frequency of Social Gatherings with Friends and Family: More than three times a week    Attends Religious Services: More than 4 times per year    Active Member of [employer] or Organizations: Yes    Attends Music therapist: More than 4 times per year    Marital Status: Divorced    Tobacco Counseling Counseling given: Not Answered   Clinical Intake:  Pre-visit preparation completed: Yes  Pain : No/denies pain  Diabetes: No  How often do you need to have someone help you when you read instructions, pamphlets, or other written materials from your doctor or pharmacy?: 1 - Never  Diabetic?No   Interpreter Needed?: No  Information entered by :: Denman George LPN   Activities of Daily Living    05/23/2022    9:05 PM 05/19/2022   10:12 AM  In your present state of health, do you have any difficulty performing the following activities:  Hearing? 0 0  Vision? 0 0  Difficulty concentrating or making decisions? 0 0  Walking or climbing stairs? 0 0  Dressing or bathing? 0 0  Doing errands, shopping? 0 0  Preparing Food and eating ? N N  Using the Toilet? N N  In the past six months, have you accidently leaked urine? N N  Do you have problems with loss of bowel control? N N  Managing your Medications? N N  Managing your Finances? N N  Housekeeping or managing your Housekeeping? N N    Patient Care Team: Janith Lima, MD as PCP - General (Internal Medicine) Johnathan Hausen, MD as Consulting Physician (General Surgery) Mauri Pole, MD as Consulting Physician (Gastroenterology)  Indicate any recent  Medical Services you may have received from other than [facility] providers in the past year (date may be approximate).     Assessment:   This is a routine wellness examination for Monica Lara.  Hearing/Vision screen Hearing Screening - Comments:: Denies hearing difficulties  Vision Screening - Comments:: Wears rx glasses - up to date with routine eye exams with [facility]    Dietary issues and exercise activities discussed: Current Exercise Habits: Home exercise routine, Time (Minutes): 30, Frequency (Times/Week): 3, Weekly Exercise (Minutes/Week): 90, Intensity: Mild   Goals Addressed             This Visit's Progress    Remain active and independent        Depression Screen    05/23/2022    9:04 PM 02/22/2022   10:30  AM 11/15/2020   10:02 AM  PHQ 2/9 Scores  PHQ - 2 Score 0 0 0  PHQ- 9 Score  3     Fall Risk    05/23/2022    9:02 PM 05/19/2022   10:12 AM 02/22/2022   10:30 AM 11/15/2020   10:02 AM  Fall Risk   Falls in the past year? 0 0 0 0  Number falls in past yr: 0 0 0 0  Injury with Fall? 0 0 0 0  Risk for fall due to : No Fall Risks  No Fall Risks   Follow up Falls prevention discussed;Education provided;Falls evaluation completed  Falls evaluation completed     FALL RISK PREVENTION PERTAINING TO THE HOME:  Any stairs in or around the home? No  If so, are there any without handrails? No  Home free of loose throw rugs in walkways, pet beds, electrical cords, etc? Yes  Adequate lighting in your home to reduce risk of falls? Yes   ASSISTIVE DEVICES UTILIZED TO PREVENT FALLS:  Life alert? No  Use of a cane, walker or w/c? No  Grab bars in the bathroom? Yes  Shower chair or bench in shower? No  Elevated toilet seat or a handicapped toilet? Yes   TIMED UP AND GO:  Was the test performed? No . Telephonic visit   Cognitive Function:        05/23/2022    9:05 PM  6CIT Screen  What Year? 0 points  What month? 0 points  What time? 0 points  Count  back from 20 0 points  Months in reverse 0 points  Repeat phrase 0 points  Total Score 0 points    Immunizations Immunization History  Administered Date(s) Administered   Fluad Quad(high Dose 65+) 11/15/2020, 11/04/2021   Hepatitis A 06/29/2010   Hepatitis A, Ped/Adol-2 Dose 06/29/2010   Hepatitis B, PED/ADOLESCENT 05/30/2010, 06/29/2010   Influenza, High Dose Seasonal PF 11/27/2016, 12/12/2016, 11/07/2017   Influenza,inj,quad, With Preservative 12/29/2013   Influenza-Unspecified 11/07/2017   PFIZER(Purple Top)SARS-COV-2 Vaccination 04/15/2019, 05/13/2019   Pneumococcal Conjugate-13 03/02/2017   Pneumococcal Polysaccharide-23 04/29/2018   Td 02/27/1998   Td (Adult), 2 Lf Tetanus Toxid, Preservative Free 02/27/1998   Tdap 02/28/2007, 02/22/2010, 10/22/2019   Zoster Recombinat (Shingrix) 02/26/2020   Zoster, Unspecified 02/26/2020    TDAP status: Up to date  Flu Vaccine status: Up to date  Pneumococcal vaccine status: Up to date  Covid-19 vaccine status: Information provided on how to obtain vaccines.   Qualifies for Shingles Vaccine? Yes   Zostavax completed No   Shingrix Completed?: No.    Education has been provided regarding the importance of this vaccine. Patient has been advised to call insurance company to determine out of pocket expense if they have not yet received this vaccine. Advised may also receive vaccine at local pharmacy or Health Dept. Verbalized acceptance and understanding.  Screening Tests Health Maintenance  Topic Date Due   DEXA SCAN  Never done   Zoster Vaccines- Shingrix (2 of 2) 04/22/2020   COVID-19 Vaccine (3 - 2023-24 season) 10/28/2021   MAMMOGRAM  03/11/2023   Medicare Annual Wellness (AWV)  05/23/2023   Fecal DNA (Cologuard)  11/23/2023   DTaP/Tdap/Td (6 - Td or Tdap) 10/21/2029   Pneumonia Vaccine 63+ Years old  Completed   INFLUENZA VACCINE  Completed   Hepatitis C Screening  Completed   HPV VACCINES  Aged Out    Health  Maintenance  Health Maintenance Due  Topic Date  Due   DEXA SCAN  Never done   Zoster Vaccines- Shingrix (2 of 2) 04/22/2020   COVID-19 Vaccine (3 - 2023-24 season) 10/28/2021    Colorectal cancer screening: Type of screening: Cologuard. Completed 11/22/20. Repeat every 3 years  Mammogram status: Completed 03/10/21. Repeat every year  Bone Density status: Ordered 122/27/23. Pt provided with contact info and advised to call to schedule appt.  Lung Cancer Screening: (Low Dose CT Chest recommended if Age 22-80 years, 30 pack-year currently smoking OR have quit w/in 15years.) does not qualify.   Lung Cancer Screening Referral: n/a  Additional Screening:  Hepatitis C Screening: does qualify; Completed 05/02/21  Vision Screening: Recommended annual ophthalmology exams for early detection of glaucoma and other disorders of the eye. Is the patient up to date with their annual eye exam?  Yes  Who is the provider or what is the name of the office in which the patient attends annual eye exams? [facility]  If pt is not established with a provider, would they like to be referred to a provider to establish care? No .   Dental Screening: Recommended annual dental exams for proper oral hygiene  Community Resource Referral / Chronic Care Management: CRR required this visit?  No   CCM required this visit?  No      Plan:     I have personally reviewed and noted the following in the patient's chart:   Medical and social history Use of alcohol, tobacco or illicit drugs  Current medications and supplements including opioid prescriptions. Patient is not currently taking opioid prescriptions. Functional ability and status Nutritional status Physical activity Advanced directives List of other physicians Hospitalizations, surgeries, and ER visits in previous 12 months Vitals Screenings to include cognitive, depression, and falls Referrals and appointments  In addition, I have  reviewed and discussed with patient certain preventive protocols, quality metrics, and best practice recommendations. A written personalized care plan for preventive services as well as general preventive health recommendations were provided to patient.     Vanetta Mulders, [place]   [id number]   Due to this being a virtual visit, the after visit summary with patients personalized plan was offered to patient via mail or my-chart. Patient would like to access on my-chart  Nurse Notes: No concerns

## 2022-05-23 NOTE — Patient Instructions (Signed)
Monica Lara , Thank you for taking time to come for your Medicare Wellness Visit. I appreciate your ongoing commitment to your health goals. Please review the following plan we discussed and let me know if I can assist you in the future.   These are the goals we discussed:  Goals      Remain active and independent        This is a list of the screening recommended for you and due dates:  Health Maintenance  Topic Date Due   DEXA scan (bone density measurement)  Never done   Zoster (Shingles) Vaccine (2 of 2) 04/22/2020   COVID-19 Vaccine (3 - 2023-24 season) 10/28/2021   Mammogram  03/11/2023   Medicare Annual Wellness Visit  05/23/2023   Cologuard (Stool DNA test)  11/23/2023   DTaP/Tdap/Td vaccine (6 - Td or Tdap) 10/21/2029   Pneumonia Vaccine  Completed   Flu Shot  Completed   Hepatitis C Screening: USPSTF Recommendation to screen - Ages 18-79 yo.  Completed   HPV Vaccine  Aged Out    Advanced directives: Please bring a copy of your health care power of attorney and living will to the office to be added to your chart at your convenience.   Conditions/risks identified: Aim for 30 minutes of exercise or brisk walking, 6-8 glasses of water, and 5 servings of fruits and vegetables each day.   Next appointment: Follow up in one year for your annual wellness visit    Preventive Care 65 Years and Older, Female Preventive care refers to lifestyle choices and visits with your health care provider that can promote health and wellness. What does preventive care include? A yearly physical exam. This is also called an annual well check. Dental exams once or twice a year. Routine eye exams. Ask your health care provider how often you should have your eyes checked. Personal lifestyle choices, including: Daily care of your teeth and gums. Regular physical activity. Eating a healthy diet. Avoiding tobacco and drug use. Limiting alcohol use. Practicing safe sex. Taking low-dose aspirin  every day. Taking vitamin and mineral supplements as recommended by your health care provider. What happens during an annual well check? The services and screenings done by your health care provider during your annual well check will depend on your age, overall health, lifestyle risk factors, and family history of disease. Counseling  Your health care provider may ask you questions about your: Alcohol use. Tobacco use. Drug use. Emotional well-being. Home and relationship well-being. Sexual activity. Eating habits. History of falls. Memory and ability to understand (cognition). Work and work Statistician. Reproductive health. Screening  You may have the following tests or measurements: Height, weight, and BMI. Blood pressure. Lipid and cholesterol levels. These may be checked every 5 years, or more frequently if you are over 37 years old. Skin check. Lung cancer screening. You may have this screening every year starting at age 7 if you have a 30-pack-year history of smoking and currently smoke or have quit within the past 15 years. Fecal occult blood test (FOBT) of the stool. You may have this test every year starting at age 29. Flexible sigmoidoscopy or colonoscopy. You may have a sigmoidoscopy every 5 years or a colonoscopy every 10 years starting at age 21. Hepatitis C blood test. Hepatitis B blood test. Sexually transmitted disease (STD) testing. Diabetes screening. This is done by checking your blood sugar (glucose) after you have not eaten for a while (fasting). You may have this done  every 1-3 years. Bone density scan. This is done to screen for osteoporosis. You may have this done starting at age 79. Mammogram. This may be done every 1-2 years. Talk to your health care provider about how often you should have regular mammograms. Talk with your health care provider about your test results, treatment options, and if necessary, the need for more tests. Vaccines  Your health  care provider may recommend certain vaccines, such as: Influenza vaccine. This is recommended every year. Tetanus, diphtheria, and acellular pertussis (Tdap, Td) vaccine. You may need a Td booster every 10 years. Zoster vaccine. You may need this after age 52. Pneumococcal 13-valent conjugate (PCV13) vaccine. One dose is recommended after age 98. Pneumococcal polysaccharide (PPSV23) vaccine. One dose is recommended after age 7. Talk to your health care provider about which screenings and vaccines you need and how often you need them. This information is not intended to replace advice given to you by your health care provider. Make sure you discuss any questions you have with your health care provider. Document Released: 03/12/2015 Document Revised: 11/03/2015 Document Reviewed: 12/15/2014 Elsevier Interactive Patient Education  2017 [place] Prevention in the Home Falls can cause injuries. They can happen to people of all ages. There are many things you can do to make your home safe and to help prevent falls. What can I do on the outside of my home? Regularly fix the edges of walkways and driveways and fix any cracks. Remove anything that might make you trip as you walk through a door, such as a raised step or threshold. Trim any bushes or trees on the path to your home. Use bright outdoor lighting. Clear any walking paths of anything that might make someone trip, such as rocks or tools. Regularly check to see if handrails are loose or broken. Make sure that both sides of any steps have handrails. Any raised decks and porches should have guardrails on the edges. Have any leaves, snow, or ice cleared regularly. Use sand or salt on walking paths during winter. Clean up any spills in your garage right away. This includes oil or grease spills. What can I do in the bathroom? Use night lights. Install grab bars by the toilet and in the tub and shower. Do not use towel bars as grab  bars. Use non-skid mats or decals in the tub or shower. If you need to sit down in the shower, use a plastic, non-slip stool. Keep the floor dry. Clean up any water that spills on the floor as soon as it happens. Remove soap buildup in the tub or shower regularly. Attach bath mats securely with double-sided non-slip rug tape. Do not have throw rugs and other things on the floor that can make you trip. What can I do in the bedroom? Use night lights. Make sure that you have a light by your bed that is easy to reach. Do not use any sheets or blankets that are too big for your bed. They should not hang down onto the floor. Have a firm chair that has side arms. You can use this for support while you get dressed. Do not have throw rugs and other things on the floor that can make you trip. What can I do in the kitchen? Clean up any spills right away. Avoid walking on wet floors. Keep items that you use a lot in easy-to-reach places. If you need to reach something above you, use a strong step stool that has  a grab bar. Keep electrical cords out of the way. Do not use floor polish or wax that makes floors slippery. If you must use wax, use non-skid floor wax. Do not have throw rugs and other things on the floor that can make you trip. What can I do with my stairs? Do not leave any items on the stairs. Make sure that there are handrails on both sides of the stairs and use them. Fix handrails that are broken or loose. Make sure that handrails are as long as the stairways. Check any carpeting to make sure that it is firmly attached to the stairs. Fix any carpet that is loose or worn. Avoid having throw rugs at the top or bottom of the stairs. If you do have throw rugs, attach them to the floor with carpet tape. Make sure that you have a light switch at the top of the stairs and the bottom of the stairs. If you do not have them, ask someone to add them for you. What else can I do to help prevent  falls? Wear shoes that: Do not have high heels. Have rubber bottoms. Are comfortable and fit you well. Are closed at the toe. Do not wear sandals. If you use a stepladder: Make sure that it is fully opened. Do not climb a closed stepladder. Make sure that both sides of the stepladder are locked into place. Ask someone to hold it for you, if possible. Clearly mark and make sure that you can see: Any grab bars or handrails. First and last steps. Where the edge of each step is. Use tools that help you move around (mobility aids) if they are needed. These include: Canes. Walkers. Scooters. Crutches. Turn on the lights when you go into a dark area. Replace any light bulbs as soon as they burn out. Set up your furniture so you have a clear path. Avoid moving your furniture around. If any of your floors are uneven, fix them. If there are any pets around you, be aware of where they are. Review your medicines with your doctor. Some medicines can make you feel dizzy. This can increase your chance of falling. Ask your doctor what other things that you can do to help prevent falls. This information is not intended to replace advice given to you by your health care provider. Make sure you discuss any questions you have with your health care provider. Document Released: 12/10/2008 Document Revised: 07/22/2015 Document Reviewed: 03/20/2014 Elsevier Interactive Patient Education  2017 [employer].

## 2022-05-29 ENCOUNTER — Encounter: Payer: Self-pay | Admitting: Internal Medicine

## 2022-06-07 ENCOUNTER — Other Ambulatory Visit: Payer: Self-pay | Admitting: Internal Medicine

## 2022-06-07 DIAGNOSIS — E785 Hyperlipidemia, unspecified: Secondary | ICD-10-CM

## 2022-06-12 ENCOUNTER — Other Ambulatory Visit (INDEPENDENT_AMBULATORY_CARE_PROVIDER_SITE_OTHER): Payer: Medicare PPO

## 2022-06-12 DIAGNOSIS — E785 Hyperlipidemia, unspecified: Secondary | ICD-10-CM

## 2022-06-12 LAB — LIPID PANEL
Cholesterol: 216 mg/dL — ABNORMAL HIGH (ref 0–200)
HDL: 54.4 mg/dL (ref >39.00)
LDL Cholesterol: 138 mg/dL — ABNORMAL HIGH (ref 0–99)
NonHDL: 161.12
Total CHOL/HDL Ratio: 4
Triglycerides: 114 mg/dL (ref 0.0–149.0)
VLDL: 22.8 mg/dL (ref 0.0–40.0)

## 2022-06-27 ENCOUNTER — Ambulatory Visit
Payer: Medicare PPO | Attending: Cardiovascular Disease | Admitting: Pharmacist Clinician (PhC)/ Clinical Pharmacy Specialist

## 2022-06-27 DIAGNOSIS — E7801 Familial hypercholesterolemia: Secondary | ICD-10-CM | POA: Diagnosis not present

## 2022-06-27 NOTE — Progress Notes (Signed)
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 female patient of Dr Monica Lara, in the office today to discuss options for cholesterol management.  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  Her LDL cholesterol baseline is in the 220 range, although did drop over 80 points with the addition of pravastatin 40 mg.  She has tolerated this well so far, and is hesitant about adding more medication for further LDL reduction.    Insurance Carrier:  [employer]  LDL Cholesterol goal:  LDL < 70  Current Medications:   pravastatin 40  Previously tried:  lipitor, crestor - myalgias; ezetimibe - gout  Family Hx:   father had heart disease 2/2 rheumatic disease, had stroke, died 40; mom no heart disease; brother on cholesterol med; 2 daughters neither with apparent familial hyperlipidemia   Social Hx: Tobacco: no Alcohol:  no    Accessory Clinical Findings   Lab Results  Component Value Date   CHOL 216 (H) 06/12/2022   HDL 54.40 06/12/2022   LDLCALC 138 (H) 06/12/2022   TRIG 114.0 06/12/2022   CHOLHDL 4 06/12/2022    Lab Results  Component Value Date   ALT 12 02/22/2022    AST 17 02/22/2022   ALKPHOS 60 02/22/2022   BILITOT 1.4 (H) 02/22/2022   Lab Results  Component Value Date   CREATININE 1.12 02/22/2022   BUN 22 02/22/2022   NA 135 02/22/2022   K 4.2 02/22/2022   CL 99 02/22/2022   CO2 28 02/22/2022   Lab Results  Component Value Date   HGBA1C 5.7 11/15/2020    Home Medications    Current Outpatient Medications  Medication Sig Dispense Refill   pravastatin (PRAVACHOL) 40 MG tablet Take 1 tablet (40 mg total) by mouth daily. 90 tablet 0   spironolactone (ALDACTONE) 25 MG tablet TAKE 1 TABLET (25 MG TOTAL) BY MOUTH DAILY. 90 tablet 0   telmisartan (MICARDIS) 40 MG tablet TAKE 1 TABLET BY MOUTH EVERY DAY 90 tablet 0   No current facility-administered medications for this visit.     Assessment & Plan    Heterozygous familial hypercholesterolemia Assessment: Patient with ASCVD and familial hyperlipidemia not at LDL goal of < 70 Most recent LDL 138 on 06/12/22 Has been compliant with moderate intensity statin : pravastatin 40 mg  Not able to tolerate higher intensity statins secondary to myalgias Reviewed options for lowering LDL cholesterol, including PCSK-9 inhibitors, bempedoic acid and inclisiran.  Discussed mechanisms of action, dosing, side effects, potential decreases in LDL cholesterol and costs.  Also reviewed potential options for patient assistance.  Plan: Patient agreeable to starting inclisiran Repeat labs after:  1 month after second injection Lipid Liver function Patient signed Monica Lara start form, will fax in for benefits verification.   Monica Lara, PharmD CPP [facility] [address]  [place], [place] [address] [phone]  06/28/2022, 7:26 AM

## 2022-06-27 NOTE — Patient Instructions (Signed)
Your Results:             Your most recent labs Goal  Total Cholesterol 216 < 200  Triglycerides 114 < 150  HDL (happy/good cholesterol) 54.4 > 40  LDL (lousy/bad cholesterol 138 < 70   Medication changes:  We will start the process to get Leqvio covered by your insurance.   Once it's approved the [facility] will reach out to you to discuss cost and set up an appointment.  You can back out at any time before the injections  Lab orders:  We want to repeat labs after 2-3 months.  We will send you a lab order to remind you once we get closer to that time.    Patient Assistance:  The [employer] foundation offers assistance to help pay for medication copays.  They will cover copays for all cholesterol lowering meds, including statins, fibrates, omega-3 oils, ezetimibe, Repatha, Praluent, Nexletol, Nexlizet.  The cards are usually good for $2,500 or 12 months, whichever comes first. Go to healthwellfoundation.org Click on "Apply Now" Answer questions as to whom is applying (patient or representative) Your disease fund will be "hypercholesterolemia - Medicare access" Select the cholesterol medication you need assistance with (Repatha, Praluent, Nexlizet...) They will ask question about qualifying diagnosis - you can mark "yes"; and do you have insurance coverage.   When they ask what type of assistance you are interested in - "copay assistance" When you submit, the approval is usually within minutes.  You will need to print the card information from the site You will need to show this information to your pharmacy, they will bill your Medicare Part D plan first -then bill [employer] --for the copay.   You can also call them at [phone], although the hold times can be quite long.   Thank you for choosing [facility]

## 2022-06-28 ENCOUNTER — Encounter: Payer: Self-pay | Admitting: Pharmacist Clinician (PhC)/ Clinical Pharmacy Specialist

## 2022-06-28 ENCOUNTER — Encounter: Payer: Self-pay | Admitting: Cardiovascular Disease

## 2022-06-28 NOTE — Assessment & Plan Note (Signed)
Assessment: Patient with ASCVD and familial hyperlipidemia not at LDL goal of < 70 Most recent LDL 138 on 06/12/22 Has been compliant with moderate intensity statin : pravastatin 40 mg  Not able to tolerate higher intensity statins secondary to myalgias Reviewed options for lowering LDL cholesterol, including PCSK-9 inhibitors, bempedoic acid and inclisiran.  Discussed mechanisms of action, dosing, side effects, potential decreases in LDL cholesterol and costs.  Also reviewed potential options for patient assistance.  Plan: Patient agreeable to starting inclisiran Repeat labs after:  1 month after second injection Lipid Liver function Patient signed Wilber Bihari start form, will fax in for benefits verification.

## 2022-07-17 ENCOUNTER — Telehealth: Payer: Self-pay | Admitting: Pharmacy Technician

## 2022-07-17 NOTE — Telephone Encounter (Addendum)
Auth Submission: APPROVED Site of care: Site of care: [facility] Payer: [employer] medicare Medication & CPT/J Code(s) submitted: Leqvio (Inclisiran) [id number] Route of submission (phone, fax, portal):  Phone # Fax # Auth type: Buy/Bill Units/visits requested: 2 doses Reference number:  Approval from: 06/28/22 to 02/27/23   [employer]: Approved 07/25/22 - 07/25/23 Id: [id number]

## 2022-07-22 ENCOUNTER — Other Ambulatory Visit: Payer: Self-pay | Admitting: Internal Medicine

## 2022-07-22 DIAGNOSIS — I10 Essential (primary) hypertension: Secondary | ICD-10-CM

## 2022-07-22 DIAGNOSIS — E876 Hypokalemia: Secondary | ICD-10-CM

## 2022-08-08 ENCOUNTER — Encounter: Payer: Self-pay | Admitting: Pharmacist Clinician (PhC)/ Clinical Pharmacy Specialist

## 2022-08-08 DIAGNOSIS — E7801 Familial hypercholesterolemia: Secondary | ICD-10-CM

## 2022-08-09 NOTE — Telephone Encounter (Signed)
I have sent a f/u email to [employer] for status of enrollment with [employer].  I will f/u once I have a response from them.  [facility] -pending status of approval she will be covered 100%. [employer] will cover the 80% and [employer]  Foundation will cover the remaining 20% amount as long as funding is still available)

## 2022-08-09 NOTE — Telephone Encounter (Signed)
Called and informed patient that we are still waiting on confirmation from [employer] that they we cover remaining 20% of the cost for injection.

## 2022-08-21 ENCOUNTER — Ambulatory Visit: Payer: Medicare PPO | Admitting: Internal Medicine

## 2022-08-21 ENCOUNTER — Encounter: Payer: Self-pay | Admitting: Internal Medicine

## 2022-08-21 VITALS — BP 126/76 | HR 67 | Temp 97.8°F | Ht 60.0 in | Wt 120.0 lb

## 2022-08-21 DIAGNOSIS — H93A1 Pulsatile tinnitus, right ear: Secondary | ICD-10-CM | POA: Diagnosis not present

## 2022-08-21 DIAGNOSIS — E785 Hyperlipidemia, unspecified: Secondary | ICD-10-CM

## 2022-08-21 DIAGNOSIS — I10 Essential (primary) hypertension: Secondary | ICD-10-CM

## 2022-08-21 DIAGNOSIS — N1832 Chronic kidney disease, stage 3b: Secondary | ICD-10-CM | POA: Diagnosis not present

## 2022-08-21 DIAGNOSIS — E2839 Other primary ovarian failure: Secondary | ICD-10-CM

## 2022-08-21 LAB — CBC WITH DIFFERENTIAL/PLATELET
Basophils Absolute: 0.1 10*3/uL (ref 0.0–0.1)
Basophils Relative: 1 % (ref 0.0–3.0)
Eosinophils Absolute: 0.2 10*3/uL (ref 0.0–0.7)
Eosinophils Relative: 2.9 % (ref 0.0–5.0)
HCT: 39.8 % (ref 36.0–46.0)
Hemoglobin: 13.1 g/dL (ref 12.0–15.0)
Lymphocytes Relative: 26.7 % (ref 12.0–46.0)
Lymphs Abs: 1.4 10*3/uL (ref 0.7–4.0)
MCHC: 32.9 g/dL (ref 30.0–36.0)
MCV: 89.9 fl (ref 78.0–100.0)
Monocytes Absolute: 0.5 10*3/uL (ref 0.1–1.0)
Monocytes Relative: 8.7 % (ref 3.0–12.0)
Neutro Abs: 3.2 10*3/uL (ref 1.4–7.7)
Neutrophils Relative %: 60.7 % (ref 43.0–77.0)
Platelets: 238 10*3/uL (ref 150.0–400.0)
RBC: 4.43 Mil/uL (ref 3.87–5.11)
RDW: 13.4 % (ref 11.5–15.5)
WBC: 5.3 10*3/uL (ref 4.0–10.5)

## 2022-08-21 LAB — BASIC METABOLIC PANEL
BUN: 21 mg/dL (ref 6–23)
CO2: 26 mEq/L (ref 19–32)
Calcium: 10 mg/dL (ref 8.4–10.5)
Chloride: 103 mEq/L (ref 96–112)
Creatinine, Ser: 1.08 mg/dL (ref 0.40–1.20)
GFR: 51.86 mL/min — ABNORMAL LOW (ref >60.00)
Glucose, Bld: 87 mg/dL (ref 70–99)
Potassium: 4.9 mEq/L (ref 3.5–5.1)
Sodium: 135 mEq/L (ref 135–145)

## 2022-08-21 NOTE — Progress Notes (Signed)
Subjective:  Patient ID: Monica Lara, female    DOB: 1950/11/23  Age: 72 y.o. MRN: [id number]  CC: Hypertension and Hyperlipidemia   HPI Monica Lara presents for f/up ----  Discussed the use of AI scribe software for clinical note transcription with the patient, who gave verbal consent to proceed.  History of Present Illness   The patient, with a history of aneurysm and hyperlipidemia, reports tolerating their current regimen of pravastatin without any significant side effects. They initially experienced mild muscle and joint aches, which have since resolved. They deny any chest pain, shortness of breath, headache, blurred vision, or dizziness.  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 Despite multiple consultations with an ENT specialist and undergoing MRIs, no definitive cause has been identified. The tinnitus is associated with hearing loss, for which she uses a hearing aid. She expresses a desire to seek further specialist input due to the persistent and bothersome nature of the symptoms.  The patient maintains an active lifestyle, walking a mile and a half daily and swimming during the summer months. They deny any chest pain or shortness of breath during these activities. They are not on any pain medications and report no side effects from their current medications, which include spironolactone, telmisartan, and pravastatin. Their aneurysm has been stable on recent check-ups.       Outpatient Medications Prior to Visit  Medication Sig Dispense Refill   pravastatin (PRAVACHOL) 40 MG tablet Take 1 tablet (40 mg total) by mouth daily. 90 tablet 0   spironolactone (ALDACTONE) 25 MG tablet TAKE 1 TABLET (25 MG TOTAL) BY MOUTH DAILY. 90 tablet 0   telmisartan (MICARDIS) 40 MG tablet TAKE 1 TABLET BY MOUTH EVERY DAY 90 tablet 0   No facility-administered medications prior to visit.    ROS Review of Systems   Constitutional: Negative.  Negative for diaphoresis and fatigue.  HENT:  Positive for hearing loss and tinnitus. Negative for congestion and sinus pressure.   Eyes: Negative.   Respiratory:  Negative for cough, chest tightness, shortness of breath and wheezing.   Cardiovascular:  Negative for chest pain, palpitations and leg swelling.  Gastrointestinal:  Negative for abdominal pain, constipation, diarrhea, nausea and vomiting.  Endocrine: Negative.   Genitourinary: Negative.  Negative for difficulty urinating.  Musculoskeletal: Negative.  Negative for arthralgias and myalgias.  Skin: Negative.   Neurological:  Negative for dizziness, weakness, light-headedness and headaches.  Hematological:  Negative for adenopathy. Does not bruise/bleed easily.  Psychiatric/Behavioral: Negative.      Objective:  BP 126/76 (BP Location: Right Arm, Patient Position: Sitting, Cuff Size: Normal)   Pulse 67   Temp 97.8 F (36.6 C) (Oral)   Ht 5' (1.524 m)   Wt 120 lb (54.4 kg)   SpO2 96%   BMI 23.44 kg/m   BP Readings from Last 3 Encounters:  08/21/22 126/76  05/10/22 (!) 148/89  05/05/22 120/74    Wt Readings from Last 3 Encounters:  08/21/22 120 lb (54.4 kg)  05/23/22 119 lb (54 kg)  05/10/22 119 lb (54 kg)    Physical Exam Vitals reviewed.  Constitutional:      Appearance: Normal appearance.  HENT:     Right Ear: Hearing, tympanic membrane, ear canal and external ear normal.     Left Ear: Hearing, tympanic membrane, ear canal and external ear normal.     Mouth/Throat:     Mouth: Mucous membranes are moist.  Eyes:  General: No scleral icterus. Cardiovascular:     Rate and Rhythm: Normal rate and regular rhythm.     Heart sounds: No murmur heard.    No friction rub. No gallop.  Pulmonary:     Effort: Pulmonary effort is normal.     Breath sounds: No stridor. No wheezing, rhonchi or rales.  Abdominal:     General: Abdomen is flat.     Palpations: There is no mass.      Tenderness: There is no abdominal tenderness. There is no guarding.     Hernia: No hernia is present.  Musculoskeletal:        General: Normal range of motion.     Cervical back: Neck supple.     Right lower leg: No edema.     Left lower leg: No edema.  Lymphadenopathy:     Cervical: No cervical adenopathy.  Skin:    General: Skin is warm and dry.  Neurological:     General: No focal deficit present.     Mental Status: She is alert. Mental status is at baseline.  Psychiatric:        Mood and Affect: Mood normal.        Behavior: Behavior normal.     Lab Results  Component Value Date   WBC 5.3 08/21/2022   HGB 13.1 08/21/2022   HCT 39.8 08/21/2022   PLT 238.0 08/21/2022   GLUCOSE 87 08/21/2022   CHOL 216 (H) 06/12/2022   TRIG 114.0 06/12/2022   HDL 54.40 06/12/2022   LDLCALC 138 (H) 06/12/2022   ALT 12 02/22/2022   AST 17 02/22/2022   NA 135 08/21/2022   K 4.9 08/21/2022   CL 103 08/21/2022   CREATININE 1.08 08/21/2022   BUN 21 08/21/2022   CO2 26 08/21/2022   TSH 1.77 02/22/2022   INR 1.0 05/06/2020   HGBA1C 5.7 11/15/2020    CT CARDIAC SCORING (SELF PAY ONLY)  Addendum Date: 05/19/2022   ADDENDUM REPORT: 05/19/2022 14:38 EXAM: OVER-READ INTERPRETATION  CT CHEST The following report is an over-read performed by radiologist Dr. Curly Shores [facility] on 05/19/2022. This over-read does not include interpretation of cardiac or coronary anatomy or pathology. The coronary calcium score interpretation by the cardiologist is attached. COMPARISON:  04/13/2022. FINDINGS: Cardiovascular: Ectatic ascending thoracic aorta. Minimal pericardial effusion. Atheromatous calcifications. Mediastinum/Nodes: No suspicious adenopathy identified. Imaged mediastinal structures are unremarkable. Lungs/Pleura: Imaged lungs are clear. No pleural effusion or pneumothorax. Upper Abdomen: No acute abnormality. Musculoskeletal: No chest wall abnormality. No acute or significant  osseous findings. IMPRESSION: 1. Ascending thoracic aorta ectatic, 3.8 cm. This is a stable finding. 2. Atheromatous calcifications aorta and coronary arteries. 3. Minimal pericardial effusion. 4. No significant cardiovascular abnormalities identified without contrast. Electronically Signed   By: Layla Maw M.D.   On: 05/19/2022 14:38   Result Date: 05/19/2022 : Cardiovascular Disease Risk stratification EXAM: Coronary Calcium Score TECHNIQUE: A gated, non-contrast computed tomography scan of the heart was performed using 3mm slice thickness. Axial images were analyzed on a dedicated workstation. Calcium scoring of the coronary arteries was performed using the Agatston method. FINDINGS: Coronary arteries: Normal origins. Coronary Calcium Score: Left main: 0 Left anterior descending artery: 333 Left circumflex artery: 41.7 Right coronary artery: 76.7 Total: 451 Percentile: 91 Pericardium: Normal. Ascending Aorta: Normal caliber. Non-cardiac: See separate report from [facility]. IMPRESSION: Coronary calcium score of 451. This was 48 percentile for age-, race-, and sex-matched controls. RECOMMENDATIONS: Coronary artery calcium (CAC) score is a  strong predictor of incident coronary heart disease (CHD) and provides predictive information beyond traditional risk factors. CAC scoring is reasonable to use in the decision to withhold, postpone, or initiate statin therapy in intermediate-risk or selected borderline-risk asymptomatic adults (age 60-75 years and LDL-C >=70 to <190 mg/dL) who do not have diabetes or established atherosclerotic cardiovascular disease (ASCVD).* In intermediate-risk (10-year ASCVD risk >=7.5% to <20%) adults or selected borderline-risk (10-year ASCVD risk >=5% to <7.5%) adults in whom a CAC score is measured for the purpose of making a treatment decision the following recommendations have been made: If CAC=0, it is reasonable to withhold statin therapy and reassess in 5 to 10  years, as long as higher risk conditions are absent (diabetes mellitus, family history of premature CHD in first degree relatives (males <55 years; females <65 years), cigarette smoking, or LDL >=190 mg/dL). If CAC is 1 to 99, it is reasonable to initiate statin therapy for patients >=40 years of age. If CAC is >=100 or >=75th percentile, it is reasonable to initiate statin therapy at any age. Cardiology referral should be considered for patients with CAC scores >=400 or >=75th percentile. *2018 AHA/ACC/AACVPR/AAPA/ABC/ACPM/ADA/AGS/APhA/ASPC/NLA/PCNA Guideline on the Management of Blood Cholesterol: A Report of the American College of Cardiology/American Heart Association Task Force on Clinical Practice Guidelines. J Am Coll Cardiol. 2019;73(24):3168-3209. Thomasene Ripple, DO [facility] The noncardiac portion of this study will be interpreted in separate report by the radiologist. Electronically Signed: By: Thomasene Ripple D.O. On: 05/16/2022 17:03    Assessment & Plan:   Essential hypertension- Her BP is well controlled. -     Basic metabolic panel; Future -     CBC with Differential/Platelet; Future  Pulsatile tinnitus of right ear -     Ambulatory referral to ENT  Hyperlipidemia LDL goal <160- LDL goal achieved. Doing well on the statin   Estrogen deficiency -     DG Bone Density; Future  Stage 3b chronic kidney disease (HCC)- Renal function is stable.     Follow-up: Return in about 6 months (around 02/20/2023).  Sanda Linger, MD

## 2022-08-21 NOTE — Patient Instructions (Signed)
Hypertension, Adult High blood pressure (hypertension) is when the force of blood pumping through the arteries is too strong. The arteries are the blood vessels that carry blood from the heart throughout the body. Hypertension forces the heart to work harder to pump blood and may cause arteries to become narrow or stiff. Untreated or uncontrolled hypertension can lead to a heart attack, heart failure, a stroke, kidney disease, and other problems. A blood pressure reading consists of a higher number over a lower number. Ideally, your blood pressure should be below 120/80. The first ("top") number is called the systolic pressure. It is a measure of the pressure in your arteries as your heart beats. The second ("bottom") number is called the diastolic pressure. It is a measure of the pressure in your arteries as the heart relaxes. What are the causes? The exact cause of this condition is not known. There are some conditions that result in high blood pressure. What increases the risk? Certain factors may make you more likely to develop high blood pressure. Some of these risk factors are under your control, including: Smoking. Not getting enough exercise or physical activity. Being overweight. Having too much fat, sugar, calories, or salt (sodium) in your diet. Drinking too much alcohol. Other risk factors include: Having a personal history of heart disease, diabetes, high cholesterol, or kidney disease. Stress. Having a family history of high blood pressure and high cholesterol. Having obstructive sleep apnea. Age. The risk increases with age. What are the signs or symptoms? High blood pressure may not cause symptoms. Very high blood pressure (hypertensive crisis) may cause: Headache. Fast or irregular heartbeats (palpitations). Shortness of breath. Nosebleed. Nausea and vomiting. Vision changes. Severe chest pain, dizziness, and seizures. How is this diagnosed? This condition is diagnosed by  measuring your blood pressure while you are seated, with your arm resting on a flat surface, your legs uncrossed, and your feet flat on the floor. The cuff of the blood pressure monitor will be placed directly against the skin of your upper arm at the level of your heart. Blood pressure should be measured at least twice using the same arm. Certain conditions can cause a difference in blood pressure between your right and left arms. If you have a high blood pressure reading during one visit or you have normal blood pressure with other risk factors, you may be asked to: Return on a different day to have your blood pressure checked again. Monitor your blood pressure at home for 1 week or longer. If you are diagnosed with hypertension, you may have other blood or imaging tests to help your health care provider understand your overall risk for other conditions. How is this treated? This condition is treated by making healthy lifestyle changes, such as eating healthy foods, exercising more, and reducing your alcohol intake. You may be referred for counseling on a healthy diet and physical activity. Your health care provider may prescribe medicine if lifestyle changes are not enough to get your blood pressure under control and if: Your systolic blood pressure is above 130. Your diastolic blood pressure is above 80. Your personal target blood pressure may vary depending on your medical conditions, your age, and other factors. Follow these instructions at home: Eating and drinking  Eat a diet that is high in fiber and potassium, and low in sodium, added sugar, and fat. An example of this eating plan is called the DASH diet. DASH stands for Dietary Approaches to Stop Hypertension. To eat this way: Eat   plenty of fresh fruits and vegetables. Try to fill one half of your plate at each meal with fruits and vegetables. Eat whole grains, such as whole-wheat pasta, brown rice, or whole-grain bread. Fill about one  fourth of your plate with whole grains. Eat or drink low-fat dairy products, such as skim milk or low-fat yogurt. Avoid fatty cuts of meat, processed or cured meats, and poultry with skin. Fill about one fourth of your plate with lean proteins, such as fish, chicken without skin, beans, eggs, or tofu. Avoid pre-made and processed foods. These tend to be higher in sodium, added sugar, and fat. Reduce your daily sodium intake. Many people with hypertension should eat less than 1,500 mg of sodium a day. Do not drink alcohol if: Your health care provider tells you not to drink. You are pregnant, may be pregnant, or are planning to become pregnant. If you drink alcohol: Limit how much you have to: 0-1 drink a day for women. 0-2 drinks a day for men. Know how much alcohol is in your drink. In the [place] one drink equals one 12 oz bottle of beer (355 mL), one 5 oz glass of wine (148 mL), or one 1 oz glass of hard liquor (44 mL). Lifestyle  Work with your health care provider to maintain a healthy body weight or to lose weight. Ask what an ideal weight is for you. Get at least 30 minutes of exercise that causes your heart to beat faster (aerobic exercise) most days of the week. Activities may include walking, swimming, or biking. Include exercise to strengthen your muscles (resistance exercise), such as Pilates or lifting weights, as part of your weekly exercise routine. Try to do these types of exercises for 30 minutes at least 3 days a week. Do not use any products that contain nicotine or tobacco. These products include cigarettes, chewing tobacco, and vaping devices, such as e-cigarettes. If you need help quitting, ask your health care provider. Monitor your blood pressure at home as told by your health care provider. Keep all follow-up visits. This is important. Medicines Take over-the-counter and prescription medicines only as told by your health care provider. Follow directions carefully. Blood  pressure medicines must be taken as prescribed. Do not skip doses of blood pressure medicine. Doing this puts you at risk for problems and can make the medicine less effective. Ask your health care provider about side effects or reactions to medicines that you should watch for. Contact a health care provider if you: Think you are having a reaction to a medicine you are taking. Have headaches that keep coming back (recurring). Feel dizzy. Have swelling in your ankles. Have trouble with your vision. Get help right away if you: Develop a severe headache or confusion. Have unusual weakness or numbness. Feel faint. Have severe pain in your chest or abdomen. Vomit repeatedly. Have trouble breathing. These symptoms may be an emergency. Get help right away. Call 911. Do not wait to see if the symptoms will go away. Do not drive yourself to the hospital. Summary Hypertension is when the force of blood pumping through your arteries is too strong. If this condition is not controlled, it may put you at risk for serious complications. Your personal target blood pressure may vary depending on your medical conditions, your age, and other factors. For most people, a normal blood pressure is less than 120/80. Hypertension is treated with lifestyle changes, medicines, or a combination of both. Lifestyle changes include losing weight, eating a healthy,   low-sodium diet, exercising more, and limiting alcohol. This information is not intended to replace advice given to you by your health care provider. Make sure you discuss any questions you have with your health care provider. Document Revised: 12/21/2020 Document Reviewed: 12/21/2020 Elsevier Patient Education  2024 [employer]  

## 2022-09-14 ENCOUNTER — Ambulatory Visit (INDEPENDENT_AMBULATORY_CARE_PROVIDER_SITE_OTHER): Payer: Medicare PPO

## 2022-09-14 VITALS — BP 114/71 | HR 68 | Temp 97.5°F | Resp 18 | Ht 60.0 in | Wt 120.8 lb

## 2022-09-14 DIAGNOSIS — E7801 Familial hypercholesterolemia: Secondary | ICD-10-CM | POA: Diagnosis not present

## 2022-09-14 MED ORDER — INCLISIRAN SODIUM 284 MG/1.5ML ~~LOC~~ SOSY
284.0000 mg | PREFILLED_SYRINGE | Freq: Once | SUBCUTANEOUS | Status: AC
  Administered 2022-09-14: 284 mg via SUBCUTANEOUS
  Filled 2022-09-14: qty 1.5

## 2022-09-14 NOTE — Patient Instructions (Signed)
Inclisiran Injection What is this medication? INCLISIRAN (in kli SIR an) treats high cholesterol. It works by decreasing bad cholesterol (such as LDL) in your blood. Changes to diet and exercise are often combined with this medication. This medicine may be used for other purposes; ask your health care provider or pharmacist if you have questions. COMMON BRAND NAME(S): LEQVIO What should I tell my care team before I take this medication? They need to know if you have any of these conditions: An unusual or allergic reaction to inclisiran, other medications, foods, dyes, or preservatives Pregnant or trying to get pregnant Breast-feeding How should I use this medication? This medication is injected under the skin. It is given by your care team in a hospital or clinic setting. Talk to your care team about the use of this medication in children. Special care may be needed. Overdosage: If you think you have taken too much of this medicine contact a poison control center or emergency room at once. NOTE: This medicine is only for you. Do not share this medicine with others. What if I miss a dose? Keep appointments for follow-up doses. It is important not to miss your dose. Call your care team if you are unable to keep an appointment. What may interact with this medication? Interactions are not expected. This list may not describe all possible interactions. Give your health care provider a list of all the medicines, herbs, non-prescription drugs, or dietary supplements you use. Also tell them if you smoke, drink alcohol, or use illegal drugs. Some items may interact with your medicine. What should I watch for while using this medication? Visit your care team for regular checks on your progress. Tell your care team if your symptoms do not start to get better or if they get worse. You may need blood work while you are taking this medication. What side effects may I notice from receiving this  medication? Side effects that you should report to your care team as soon as possible: Allergic reactions--skin rash, itching, hives, swelling of the face, lips, tongue, or throat Side effects that usually do not require medical attention (report these to your care team if they continue or are bothersome): Joint pain Pain, redness, or irritation at injection site This list may not describe all possible side effects. Call your doctor for medical advice about side effects. You may report side effects to FDA at 1-800-FDA-1088. Where should I keep my medication? This medication is given in a hospital or clinic. It will not be stored at home. NOTE: This sheet is a summary. It may not cover all possible information. If you have questions about this medicine, talk to your doctor, pharmacist, or health care provider.  2024 Elsevier/Gold Standard (2022-04-23 00:00:00)  

## 2022-09-14 NOTE — Progress Notes (Signed)
Diagnosis: Hyperlipidemia  Provider:  Mannam, Praveen MD  Procedure: Injection  Leqvio (inclisiran), Dose: 284 mg, Site: subcutaneous, Number of injections: 1  Post Care: Observation period completed  Discharge: Condition: Good, Destination: Home . AVS Provided  Performed by:  Sabitri  Ranabhat, RN       

## 2022-10-19 ENCOUNTER — Other Ambulatory Visit: Payer: Self-pay | Admitting: Internal Medicine

## 2022-10-19 DIAGNOSIS — I10 Essential (primary) hypertension: Secondary | ICD-10-CM

## 2022-10-19 DIAGNOSIS — E876 Hypokalemia: Secondary | ICD-10-CM

## 2022-11-11 ENCOUNTER — Other Ambulatory Visit: Payer: Self-pay | Admitting: Internal Medicine

## 2022-11-11 DIAGNOSIS — E7801 Familial hypercholesterolemia: Secondary | ICD-10-CM

## 2022-11-20 DIAGNOSIS — H93A1 Pulsatile tinnitus, right ear: Secondary | ICD-10-CM | POA: Diagnosis not present

## 2022-12-05 ENCOUNTER — Telehealth: Payer: Self-pay | Admitting: Internal Medicine

## 2022-12-05 NOTE — Telephone Encounter (Signed)
Patient called and said she is having itching and bumps appearing in her vaginal area. She said she does not have an gynecologist and is unable to find an office that is accepting new patients. She would like to know if someone in the office can see her for this issue. Patient would like a call back at [phone].

## 2022-12-06 ENCOUNTER — Encounter: Payer: Self-pay | Admitting: Family Medicine

## 2022-12-06 ENCOUNTER — Other Ambulatory Visit (HOSPITAL_COMMUNITY)
Admission: RE | Admit: 2022-12-06 | Discharge: 2022-12-06 | Disposition: A | Discharge status: Home / Self Care | Payer: Medicare PPO | Source: Ambulatory Visit | Attending: Family Medicine | Admitting: Family Medicine

## 2022-12-06 ENCOUNTER — Ambulatory Visit: Payer: Medicare PPO | Admitting: Family Medicine

## 2022-12-06 VITALS — BP 130/86 | HR 80 | Temp 97.6°F | Ht 60.0 in | Wt 120.0 lb

## 2022-12-06 DIAGNOSIS — N898 Other specified noninflammatory disorders of vagina: Secondary | ICD-10-CM

## 2022-12-06 DIAGNOSIS — L292 Pruritus vulvae: Secondary | ICD-10-CM | POA: Insufficient documentation

## 2022-12-06 DIAGNOSIS — Z113 Encounter for screening for infections with a predominantly sexual mode of transmission: Secondary | ICD-10-CM | POA: Insufficient documentation

## 2022-12-06 DIAGNOSIS — B3731 Acute candidiasis of vulva and vagina: Secondary | ICD-10-CM | POA: Diagnosis not present

## 2022-12-06 DIAGNOSIS — A630 Anogenital (venereal) warts: Secondary | ICD-10-CM | POA: Diagnosis not present

## 2022-12-06 MED ORDER — VALACYCLOVIR HCL 1 G PO TABS: 1000.0000 mg | ORAL_TABLET | Freq: Two times a day (BID) | ORAL | 0 refills | Status: AC

## 2022-12-06 MED ORDER — FLUCONAZOLE 150 MG PO TABS: 150.0000 mg | ORAL_TABLET | Freq: Once | ORAL | 0 refills | Status: AC

## 2022-12-06 NOTE — Patient Instructions (Signed)
Take the one tablet for yeast, fluconazole.   Take the antiviral as prescribed.   I have referred you to a gynecologist and they will call you to schedule.   I also referred you to [facility] and they will call you.

## 2022-12-06 NOTE — Progress Notes (Signed)
Subjective:     Patient ID: Monica Lara, female    DOB: 09/22/1950, 72 y.o.   MRN: [id number]  Chief Complaint  Patient presents with   Vaginal Itching    Itchiness going on for several days, bump found 2 days ago    HPI  History of Present Illness          C/o 1 wk hx of vaginal discharge, itching and irritation. States she noticed a growth between her vaginal opening and anus that is tender.   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  No sexual activity in 20+ years.     Health Maintenance Due  Topic Date Due   DEXA SCAN  Never done   Zoster Vaccines- Shingrix (2 of 2) 04/22/2020   INFLUENZA VACCINE  09/28/2022   COVID-19 Vaccine (3 - 2023-24 season) 10/29/2022    Past Medical History:  Diagnosis Date   Hypertension     Past Surgical History:  Procedure Laterality Date   DILATION AND CURETTAGE OF UTERUS     ESOPHAGOGASTRODUODENOSCOPY (EGD) WITH PROPOFOL N/A 05/04/2020   Procedure: ESOPHAGOGASTRODUODENOSCOPY (EGD) WITH PROPOFOL;  Surgeon: Napoleon Form, MD;  Location: [facility];  Service: Endoscopy;  Laterality: N/A;   HIATAL HERNIA REPAIR N/A 05/06/2020   Procedure: LAPAROSCOPIC REPAIR OF HIATAL HERNIA;  Surgeon: Luretha Murphy, MD;  Location: [facility];  Service: General;  Laterality: N/A;   LAPAROSCOPIC NISSEN FUNDOPLICATION N/A 05/06/2020   Procedure: POSSIBLE LAPAROSCOPIC NISSEN FUNDOPLICATION;  Surgeon: Luretha Murphy, MD;  Location: [facility];  Service: General;  Laterality: N/A;    Family History  Problem Relation Age of Onset   Hypertension Father    Congestive Heart Failure Father     Social History   Socioeconomic History   Marital status: Single    Spouse name: Not on file   Number of children: Not on file   Years of education: Not on file   Highest education level: Master's degree (e.g., MA, MS, MEng, MEd, MSW, MBA)  Occupational History   Not on file  Tobacco Use   Smoking status: Never   Smokeless tobacco: Never  Vaping Use   Vaping  status: Never Used  Substance and Sexual Activity   Alcohol use: Never   Drug use: Never   Sexual activity: Not on file  Other Topics Concern   Not on file  Social History Narrative   Not on file   Social Determinants of Health   Financial Resource Strain: Low Risk  (08/17/2022)   Overall Financial Resource Strain (CARDIA)    Difficulty of Paying Living Expenses: Not hard at all  Food Insecurity: No Food Insecurity (08/17/2022)   Hunger Vital Sign    Worried About Running Out of Food in the Last Year: Never true    Ran Out of Food in the Last Year: Never true  Transportation Needs: No Transportation Needs (08/17/2022)   PRAPARE - Administrator, Civil Service (Medical): No    Lack of Transportation (Non-Medical): No  Physical Activity: Sufficiently Active (08/17/2022)   Exercise Vital Sign    Days of Exercise per Week: 5 days    Minutes of Exercise per Session: 30 min  Stress: No Stress Concern Present (08/17/2022)   [employer] of Occupational Health - Occupational Stress Questionnaire    Feeling of Stress : Not at all  Social Connections: Moderately Integrated (08/17/2022)   Social Connection and Isolation Panel [NHANES]    Frequency of Communication with Friends and Family:  More than three times a week    Frequency of Social Gatherings with Friends and Family: Three times a week    Attends Religious Services: More than 4 times per year    Active Member of Clubs or Organizations: Yes    Attends Banker Meetings: 1 to 4 times per year    Marital Status: Divorced  Intimate Partner Violence: Not At Risk (05/23/2022)   Humiliation, Afraid, Rape, and Kick questionnaire    Fear of Current or Ex-Partner: No    Emotionally Abused: No    Physically Abused: No    Sexually Abused: No    Outpatient Medications Prior to Visit  Medication Sig Dispense Refill   inclisiran (LEQVIO) 284 MG/1.5ML SOSY injection      spironolactone (ALDACTONE) 25 MG tablet TAKE  1 TABLET (25 MG TOTAL) BY MOUTH DAILY. 90 tablet 0   telmisartan (MICARDIS) 40 MG tablet TAKE 1 TABLET BY MOUTH EVERY DAY 90 tablet 0   pravastatin (PRAVACHOL) 40 MG tablet TAKE 1 TABLET BY MOUTH EVERY DAY (Patient not taking: Reported on 12/06/2022) 90 tablet 1   No facility-administered medications prior to visit.    Allergies  Allergen Reactions   3-Methyl-2-Benzothiazolinone Hydrazone     Other reaction(s): Other   Ezetimibe Other (See Comments)    Other reaction(s): Myalgias (intolerance)   Sulfa Antibiotics Rash   Statins Other (See Comments)    intolarence    Review of Systems  Constitutional:  Negative for chills, fever, malaise/fatigue and weight loss.  Respiratory:  Negative for shortness of breath.   Cardiovascular:  Negative for chest pain and palpitations.  Gastrointestinal:  Negative for abdominal pain, constipation, diarrhea, nausea and vomiting.  Genitourinary:  Negative for dysuria, frequency and urgency.  Skin:  Positive for itching.  Neurological:  Negative for dizziness and focal weakness.       Objective:    Physical Exam Exam conducted with a chaperone present.  Constitutional:      General: She is not in acute distress.    Appearance: She is not ill-appearing.  Eyes:     Extraocular Movements: Extraocular movements intact.     Conjunctiva/sclera: Conjunctivae normal.  Cardiovascular:     Rate and Rhythm: Normal rate.  Pulmonary:     Effort: Pulmonary effort is normal.  Genitourinary:    Labia:        Left: Tenderness present.        Comments: Growth in perineum consistent with warts, TTP Vulvar area with erythema, mild TTP. Vaginal vault with white, thick discharge. Speculum exam not done.  Musculoskeletal:     Cervical back: Normal range of motion and neck supple.     Right lower leg: No edema.     Left lower leg: No edema.  Skin:    General: Skin is warm and dry.  Neurological:     General: No focal deficit present.     Mental Status:  She is alert and oriented to person, place, and time.  Psychiatric:        Mood and Affect: Mood normal.        Behavior: Behavior normal.        Thought Content: Thought content normal.      BP 130/86 (BP Location: Left Arm, Patient Position: Sitting, Cuff Size: Normal)   Pulse 80   Temp 97.6 F (36.4 C) (Temporal)   Ht 5' (1.524 m)   Wt 120 lb (54.4 kg)   SpO2 98%   BMI 23.44  kg/m  Wt Readings from Last 3 Encounters:  12/06/22 120 lb (54.4 kg)  09/14/22 120 lb 12.8 oz (54.8 kg)  08/21/22 120 lb (54.4 kg)       Assessment & Plan:   Problem List Items Addressed This Visit   None Visit Diagnoses     Vaginal discharge    -  Primary   Relevant Medications   fluconazole (DIFLUCAN) 150 MG tablet   Other Relevant Orders   Cervicovaginal ancillary only( [place])   Ambulatory referral to Gynecology   Vulvar itching       Relevant Medications   fluconazole (DIFLUCAN) 150 MG tablet   Other Relevant Orders   Cervicovaginal ancillary only( [place])   Ambulatory referral to Gynecology   Genital warts       Relevant Medications   valACYclovir (VALTREX) 1000 MG tablet   fluconazole (DIFLUCAN) 150 MG tablet   Other Relevant Orders   Ambulatory referral to General Surgery   Ambulatory referral to Gynecology      Consulted with Dr. Okey Dupre who also examined patient.  Discussed that she appears to have genital warts. Vaginal swab performed.  I will treat her with valacyclovir in case of herpetic involvement. Oral diflucan also prescribed. Referral to gynecologist since she does not have one.  I will also refer to general surgery due to location and size of wart like lesion.   Visit time 20 minutes in face to face communication with patient and coordination of care, additional 12 minutes spent in record review, coordination or care, ordering tests, communicating/referring to other healthcare professionals, documenting in medical records all on the same day of the visit  for total time 32 minutes spent on the visit.    I am having Monica Lara start on valACYclovir and fluconazole. I am also having her maintain her telmisartan, spironolactone, pravastatin, and Leqvio.  Meds ordered this encounter  Medications   valACYclovir (VALTREX) 1000 MG tablet    Sig: Take 1 tablet (1,000 mg total) by mouth 2 (two) times daily for 10 days.    Dispense:  20 tablet    Refill:  0    Order Specific Question:   Supervising Provider    Answer:   Hillard Danker A [[id number]]   fluconazole (DIFLUCAN) 150 MG tablet    Sig: Take 1 tablet (150 mg total) by mouth once for 1 dose.    Dispense:  1 tablet    Refill:  0    Order Specific Question:   Supervising Provider    Answer:   Hillard Danker A [[id number]]

## 2022-12-07 ENCOUNTER — Encounter: Payer: Self-pay | Admitting: Family Medicine

## 2022-12-07 LAB — CERVICOVAGINAL ANCILLARY ONLY
Bacterial Vaginitis (gardnerella): NEGATIVE
Candida Glabrata: NEGATIVE
Candida Vaginitis: POSITIVE — AB
Chlamydia: NEGATIVE
Comment: NEGATIVE
Comment: NEGATIVE
Comment: NEGATIVE
Comment: NEGATIVE
Comment: NEGATIVE
Comment: NORMAL
Neisseria Gonorrhea: NEGATIVE
Trichomonas: NEGATIVE

## 2022-12-08 ENCOUNTER — Other Ambulatory Visit: Payer: Self-pay | Admitting: Family Medicine

## 2022-12-08 MED ORDER — NYSTATIN-TRIAMCINOLONE 100000-0.1 UNIT/GM-% EX OINT: 1.0000 | TOPICAL_OINTMENT | Freq: Two times a day (BID) | CUTANEOUS | 0 refills | Status: DC

## 2022-12-13 ENCOUNTER — Encounter: Payer: Self-pay | Admitting: Cardiovascular Disease

## 2022-12-15 ENCOUNTER — Ambulatory Visit (INDEPENDENT_AMBULATORY_CARE_PROVIDER_SITE_OTHER): Payer: Medicare PPO | Admitting: *Deleted

## 2022-12-15 VITALS — BP 133/75 | HR 91 | Temp 98.0°F | Resp 16 | Ht 60.0 in | Wt 121.4 lb

## 2022-12-15 DIAGNOSIS — E7801 Familial hypercholesterolemia: Secondary | ICD-10-CM

## 2022-12-15 MED ORDER — INCLISIRAN SODIUM 284 MG/1.5ML ~~LOC~~ SOSY
284.0000 mg | PREFILLED_SYRINGE | Freq: Once | SUBCUTANEOUS | Status: AC
  Administered 2022-12-15: 284 mg via SUBCUTANEOUS
  Filled 2022-12-15: qty 1.5

## 2022-12-15 NOTE — Progress Notes (Signed)
Diagnosis: Hyperlipidemia  Provider:  Chilton Greathouse MD  Procedure: Injection  Leqvio (inclisiran), Dose: 284 mg, Site: subcutaneous, Number of injections: 1  Post Care: Observation period completed  Discharge: Condition: Good, Destination: Home . AVS Declined  Performed by:  Forrest Moron, RN

## 2022-12-18 ENCOUNTER — Other Ambulatory Visit: Payer: Self-pay | Admitting: Family Medicine

## 2022-12-18 MED ORDER — FLUCONAZOLE 150 MG PO TABS: 150.0000 mg | ORAL_TABLET | Freq: Once | ORAL | 0 refills | Status: AC

## 2022-12-18 NOTE — Telephone Encounter (Signed)
Do you wish for pt to return for ov?

## 2022-12-22 ENCOUNTER — Encounter: Payer: Self-pay | Admitting: Pharmacist Clinician (PhC)/ Clinical Pharmacy Specialist

## 2022-12-22 DIAGNOSIS — E7801 Familial hypercholesterolemia: Secondary | ICD-10-CM

## 2023-01-04 DIAGNOSIS — E7801 Familial hypercholesterolemia: Secondary | ICD-10-CM | POA: Diagnosis not present

## 2023-01-05 LAB — LIPID PANEL
Chol/HDL Ratio: 3.7 ratio (ref 0.0–4.4)
Cholesterol, Total: 193 mg/dL (ref 100–199)
HDL: 52 mg/dL (ref >39)
LDL Chol Calc (NIH): 118 mg/dL — ABNORMAL HIGH (ref 0–99)
Triglycerides: 128 mg/dL (ref 0–149)
VLDL Cholesterol Cal: 23 mg/dL (ref 5–40)

## 2023-01-08 IMAGING — DX DG ABDOMEN 1V
1 series · 1 of 1 positions shown · non-contrast
Comparison: Radiograph earlier today chest CTA earlier today.

CLINICAL DATA: Nasogastric tube placement.

EXAM:
ABDOMEN - 1 VIEW

[abdomen kub]
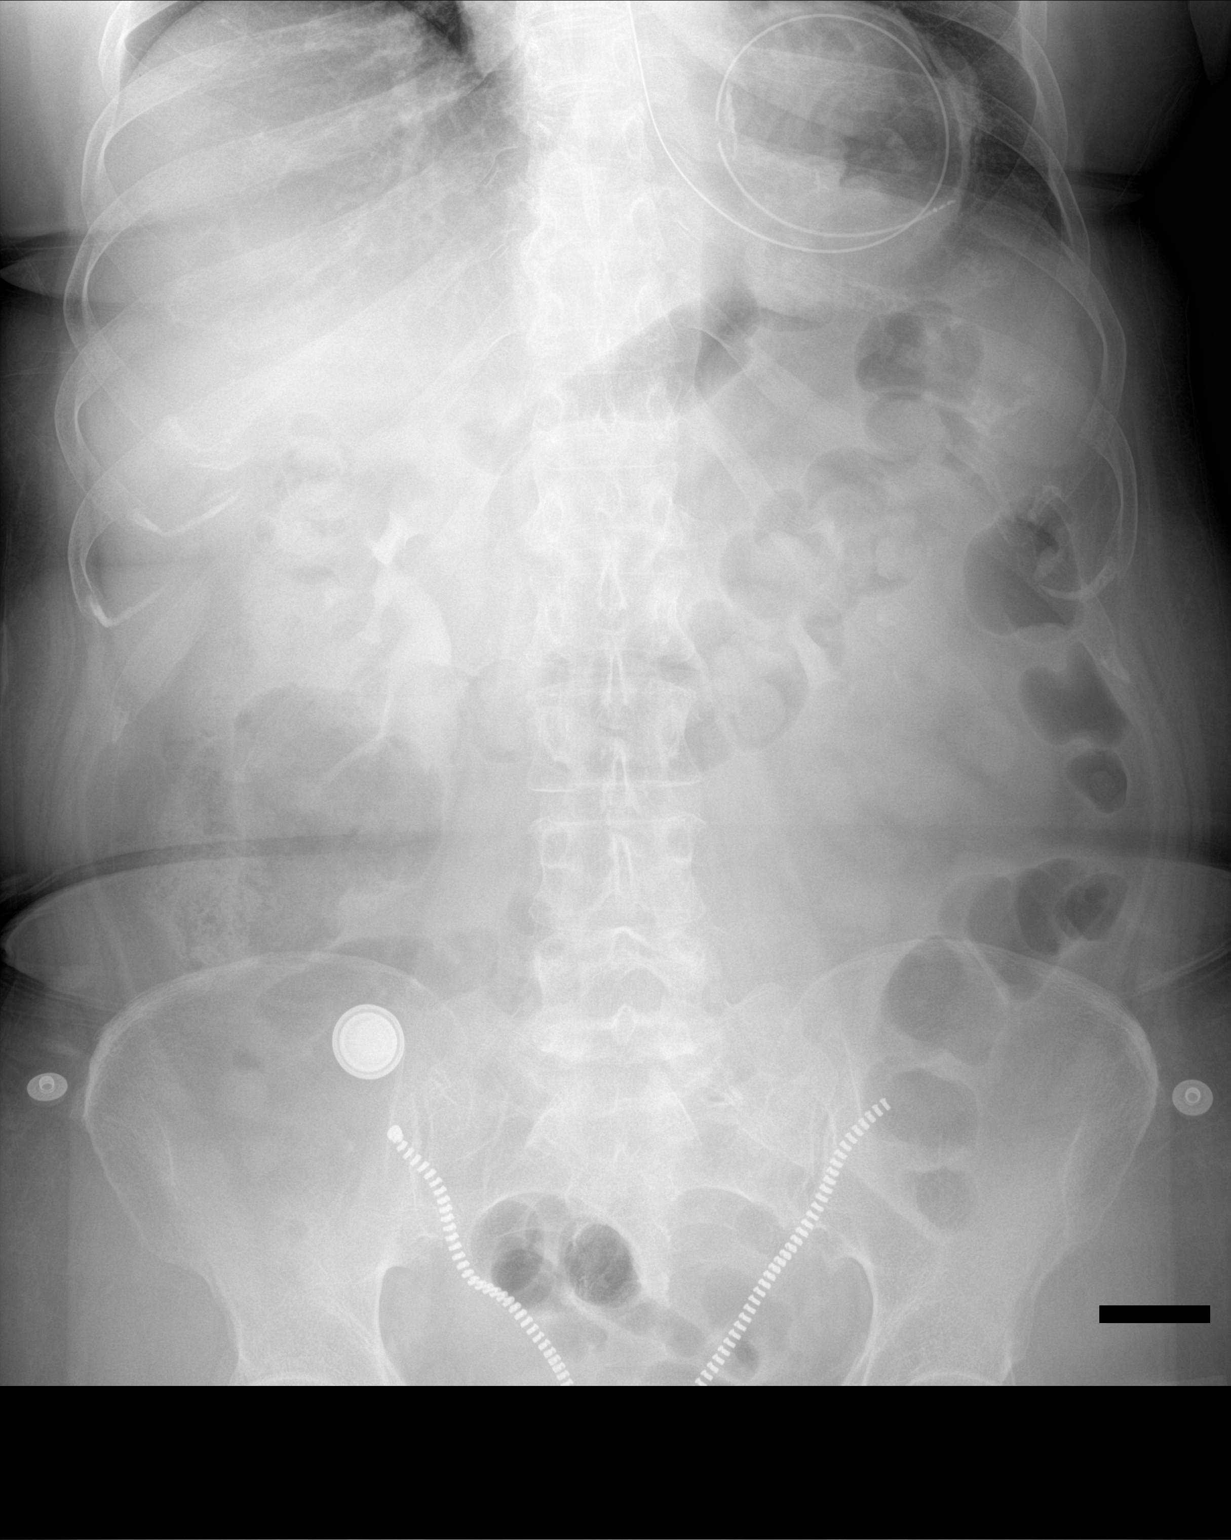

[1 of 1 positions shown; findings below may reference images not displayed]

FINDINGS: Enteric tube has been retracted and now appears coiled above the
diaphragm within hiatal hernia. No small bowel dilatation. Excreted
IV contrast within both renal collecting systems and urinary
bladder.
IMPRESSION: Enteric tube has been retracted and now appears coiled above the
diaphragm within hiatal hernia.

## 2023-01-13 ENCOUNTER — Other Ambulatory Visit: Payer: Self-pay | Admitting: Internal Medicine

## 2023-01-13 DIAGNOSIS — I10 Essential (primary) hypertension: Secondary | ICD-10-CM

## 2023-01-13 DIAGNOSIS — E876 Hypokalemia: Secondary | ICD-10-CM

## 2023-01-17 ENCOUNTER — Encounter: Payer: Self-pay | Admitting: Obstetrics and Gynecology

## 2023-01-17 ENCOUNTER — Ambulatory Visit: Payer: Medicare PPO | Admitting: Obstetrics and Gynecology

## 2023-01-17 ENCOUNTER — Encounter: Payer: Self-pay | Admitting: Cardiovascular Disease

## 2023-01-17 VITALS — BP 118/78 | HR 85 | Ht 59.25 in | Wt 122.0 lb

## 2023-01-17 DIAGNOSIS — D219 Benign neoplasm of connective and other soft tissue, unspecified: Secondary | ICD-10-CM

## 2023-01-17 DIAGNOSIS — N898 Other specified noninflammatory disorders of vagina: Secondary | ICD-10-CM

## 2023-01-17 DIAGNOSIS — Z1231 Encounter for screening mammogram for malignant neoplasm of breast: Secondary | ICD-10-CM

## 2023-01-17 LAB — WET PREP FOR TRICH, YEAST, CLUE

## 2023-01-17 NOTE — Progress Notes (Signed)
   Acute Office Visit  Subjective:    Patient ID: Monica Lara, female    DOB: 1950-06-16, 72 y.o.   MRN: [id number]   HPI 72 y.o. presents today for NGYN (NGYN//jj/Pt c/o sticky discharge, no vaginal itching or irritation) . Recent treatment for yeast x2 and now some sticky discharge. Has uterus and ovaries Reports history of fibroids No LMP recorded. Patient is postmenopausal.    Review of Systems     Objective:    Physical Exam Genitourinary:      Right Adnexa: no mass present.    Left Adnexa: no mass present.    No cervical motion tenderness.     Uterus is enlarged and irregular.     Uterine mass present.    Uterus is anteverted.     BP 118/78   Pulse 85   Ht 4' 11.25" (1.505 m)   Wt 122 lb (55.3 kg)   SpO2 99%   BMI 24.43 kg/m  Wt Readings from Last 3 Encounters:  01/17/23 122 lb (55.3 kg)  12/15/22 121 lb 6.4 oz (55.1 kg)  12/06/22 120 lb (54.4 kg)        Patient informed chaperone available to be present for breast and/or pelvic exam. Patient has requested no chaperone to be present. Patient has been advised what will be completed during breast and pelvic exam.   Assessment & Plan:  1. Encounter for screening mammogram for malignant neoplasm of breast  - MM 3D DIAGNOSTIC MAMMOGRAM BILATERAL BREAST; Future  2. Vaginal discharge Wet mount placed: and negative - US PELVIC COMPLETE WITH TRANSVAGINAL; Future  3. Fibroids  - US PELVIC COMPLETE WITH TRANSVAGINAL; Future   Earley Favor

## 2023-02-03 ENCOUNTER — Telehealth: Payer: Medicare PPO | Admitting: Nurse Practitioner

## 2023-02-03 DIAGNOSIS — B9689 Other specified bacterial agents as the cause of diseases classified elsewhere: Secondary | ICD-10-CM

## 2023-02-03 DIAGNOSIS — J329 Chronic sinusitis, unspecified: Secondary | ICD-10-CM

## 2023-02-03 MED ORDER — FLUTICASONE PROPIONATE 50 MCG/ACT NA SUSP
2.0000 | Freq: Every day | NASAL | 0 refills | Status: DC
Start: 2023-02-03 — End: 2023-04-23

## 2023-02-03 MED ORDER — AMOXICILLIN-POT CLAVULANATE 875-125 MG PO TABS
1.0000 | ORAL_TABLET | Freq: Two times a day (BID) | ORAL | 0 refills | Status: AC
Start: 2023-02-03 — End: 2023-02-10

## 2023-02-03 NOTE — Progress Notes (Signed)
E-Visit for Sinus Problems  We are sorry that you are not feeling well.  Here is how we plan to help!  Based on what you have shared with me it looks like you have sinusitis.  Sinusitis is inflammation and infection in the sinus cavities of the head.  Based on your presentation I believe you most likely have Acute Bacterial Sinusitis.  This is an infection caused by bacteria and is treated with antibiotics. I have prescribed Augmentin 875mg /125mg  one tablet twice daily with food, for 7 days. As well as a steroid nasal spray  You may use an oral decongestant such as Mucinex D or if you have glaucoma or high blood pressure use plain Mucinex. Saline nasal spray help and can safely be used as often as needed for congestion.  If you develop worsening sinus pain, fever or notice severe headache and vision changes, or if symptoms are not better after completion of antibiotic, please schedule an appointment with a health care provider.    Sinus infections are not as easily transmitted as other respiratory infection, however we still recommend that you avoid close contact with loved ones, especially the very young and elderly.  Remember to wash your hands thoroughly throughout the day as this is the number one way to prevent the spread of infection!  Home Care: Only take medications as instructed by your medical team. Complete the entire course of an antibiotic. Do not take these medications with alcohol. A steam or ultrasonic humidifier can help congestion.  You can place a towel over your head and breathe in the steam from hot water coming from a faucet. Avoid close contacts especially the very young and the elderly. Cover your mouth when you cough or sneeze. Always remember to wash your hands.  Get Help Right Away If: You develop worsening fever or sinus pain. You develop a severe head ache or visual changes. Your symptoms persist after you have completed your treatment plan.  Make sure  you Understand these instructions. Will watch your condition. Will get help right away if you are not doing well or get worse.  Thank you for choosing an e-visit.  Your e-visit answers were reviewed by a board certified advanced clinical practitioner to complete your personal care plan. Depending upon the condition, your plan could have included both over the counter or prescription medications.  Please review your pharmacy choice. Make sure the pharmacy is open so you can pick up prescription now. If there is a problem, you may contact your provider through [employer] and have the prescription routed to another pharmacy.  Your safety is important to [place]. If you have drug allergies check your prescription carefully.   For the next 24 hours you can use MyChart to ask questions about today's visit, request a non-urgent call back, or ask for a work or school excuse. You will get an email in the next two days asking about your experience. I hope that your e-visit has been valuable and will speed your recovery.

## 2023-02-03 NOTE — Progress Notes (Signed)
I have spent 5 minutes in review of e-visit questionnaire, review and updating patient chart, medical decision making and response to patient.  ° °Jerrell Mangel W Secilia Apps, NP ° °  °

## 2023-02-25 ENCOUNTER — Other Ambulatory Visit: Payer: Self-pay | Admitting: Nurse Practitioner

## 2023-02-25 DIAGNOSIS — B9689 Other specified bacterial agents as the cause of diseases classified elsewhere: Secondary | ICD-10-CM

## 2023-03-01 ENCOUNTER — Ambulatory Visit: Payer: Medicare PPO | Admitting: Internal Medicine

## 2023-03-01 VITALS — BP 132/86 | HR 71 | Temp 97.9°F | Resp 16 | Ht 59.0 in | Wt 122.2 lb

## 2023-03-01 DIAGNOSIS — E7801 Familial hypercholesterolemia: Secondary | ICD-10-CM

## 2023-03-01 DIAGNOSIS — Z0000 Encounter for general adult medical examination without abnormal findings: Secondary | ICD-10-CM

## 2023-03-01 DIAGNOSIS — N1832 Chronic kidney disease, stage 3b: Secondary | ICD-10-CM

## 2023-03-01 DIAGNOSIS — E785 Hyperlipidemia, unspecified: Secondary | ICD-10-CM | POA: Diagnosis not present

## 2023-03-01 DIAGNOSIS — I10 Essential (primary) hypertension: Secondary | ICD-10-CM

## 2023-03-01 DIAGNOSIS — Z0001 Encounter for general adult medical examination with abnormal findings: Secondary | ICD-10-CM | POA: Insufficient documentation

## 2023-03-01 LAB — HEPATIC FUNCTION PANEL
ALT: 10 U/L (ref 0–35)
AST: 17 U/L (ref 0–37)
Albumin: 4.5 g/dL (ref 3.5–5.2)
Alkaline Phosphatase: 60 U/L (ref 39–117)
Bilirubin, Direct: 0.1 mg/dL (ref 0.0–0.3)
Total Bilirubin: 1 mg/dL (ref 0.2–1.2)
Total Protein: 7.5 g/dL (ref 6.0–8.3)

## 2023-03-01 LAB — BASIC METABOLIC PANEL
BUN: 21 mg/dL (ref 6–23)
CO2: 26 meq/L (ref 19–32)
Calcium: 9.6 mg/dL (ref 8.4–10.5)
Chloride: 103 meq/L (ref 96–112)
Creatinine, Ser: 1.07 mg/dL (ref 0.40–1.20)
GFR: 52.25 mL/min — ABNORMAL LOW (ref >60.00)
Glucose, Bld: 90 mg/dL (ref 70–99)
Potassium: 4.6 meq/L (ref 3.5–5.1)
Sodium: 137 meq/L (ref 135–145)

## 2023-03-01 LAB — CBC WITH DIFFERENTIAL/PLATELET
Basophils Absolute: 0.1 10*3/uL (ref 0.0–0.1)
Basophils Relative: 1.4 % (ref 0.0–3.0)
Eosinophils Absolute: 0.1 10*3/uL (ref 0.0–0.7)
Eosinophils Relative: 3.1 % (ref 0.0–5.0)
HCT: 39.3 % (ref 36.0–46.0)
Hemoglobin: 13.2 g/dL (ref 12.0–15.0)
Lymphocytes Relative: 31.7 % (ref 12.0–46.0)
Lymphs Abs: 1.4 10*3/uL (ref 0.7–4.0)
MCHC: 33.6 g/dL (ref 30.0–36.0)
MCV: 90.3 fL (ref 78.0–100.0)
Monocytes Absolute: 0.5 10*3/uL (ref 0.1–1.0)
Monocytes Relative: 10.7 % (ref 3.0–12.0)
Neutro Abs: 2.3 10*3/uL (ref 1.4–7.7)
Neutrophils Relative %: 53.1 % (ref 43.0–77.0)
Platelets: 243 10*3/uL (ref 150.0–400.0)
RBC: 4.35 Mil/uL (ref 3.87–5.11)
RDW: 14 % (ref 11.5–15.5)
WBC: 4.3 10*3/uL (ref 4.0–10.5)

## 2023-03-01 LAB — TSH: TSH: 1.7 u[IU]/mL (ref 0.35–5.50)

## 2023-03-01 NOTE — Progress Notes (Signed)
 Subjective:  Patient ID: Monica Lara, female    DOB: [birth year]  Age: 73 y.o. MRN: [id number]  CC: Annual Exam and Hypertension   HPI Monica Lara presents for a CPX and f/up ---  Discussed the use of AI scribe software for clinical note transcription with the patient, who gave verbal consent to proceed.  History of Present Illness   The patient has been maintaining an active lifestyle without experiencing any chest pain, shortness of breath, dizziness, or lightheadedness. She denies any muscle or joint aches. However, she reported two recent infections, a yeast infection and a sinus infection, following her last injection of leqvio . The infections have since resolved.  She had a bone density test approximately four to five years ago, but does not wish to repeat it. She denies any symptoms of high blood pressure such as headache, blurred vision, dizziness, or lightheadedness.  The patient's last EKG was performed by a cardiologist, but she does not recall the exact timing.       Outpatient Medications Prior to Visit  Medication Sig Dispense Refill   fluticasone  (FLONASE ) 50 MCG/ACT nasal spray Place 2 sprays into both nostrils daily. 16 g 0   inclisiran (LEQVIO ) 284 MG/1.5ML SOSY injection      spironolactone  (ALDACTONE ) 25 MG tablet TAKE 1 TABLET (25 MG TOTAL) BY MOUTH DAILY. 90 tablet 0   telmisartan  (MICARDIS ) 40 MG tablet TAKE 1 TABLET BY MOUTH EVERY DAY 90 tablet 0   No facility-administered medications prior to visit.    ROS Review of Systems  Constitutional:  Negative for appetite change, diaphoresis, fatigue and unexpected weight change.  HENT: Negative.    Eyes: Negative.   Respiratory: Negative.  Negative for cough, chest tightness, shortness of breath and wheezing.   Cardiovascular:  Negative for chest pain, palpitations and leg swelling.  Gastrointestinal:  Negative for abdominal pain, constipation, diarrhea, nausea and vomiting.  Genitourinary: Negative.  Negative  for difficulty urinating.  Musculoskeletal: Negative.  Negative for arthralgias and myalgias.  Skin: Negative.   Neurological:  Negative for dizziness, weakness and headaches.  Hematological:  Negative for adenopathy. Does not bruise/bleed easily.  Psychiatric/Behavioral: Negative.      Objective:  BP 132/86 (BP Location: Left Arm, Patient Position: Sitting, Cuff Size: Normal)   Pulse 71   Temp 97.9 F (36.6 C) (Oral)   Resp 16   Ht 4' 11 (1.499 m)   Wt 122 lb 3.2 oz (55.4 kg)   SpO2 97%   BMI 24.68 kg/m   BP Readings from Last 3 Encounters:  03/01/23 132/86  01/17/23 118/78  12/15/22 133/75    Wt Readings from Last 3 Encounters:  03/01/23 122 lb 3.2 oz (55.4 kg)  01/17/23 122 lb (55.3 kg)  12/15/22 121 lb 6.4 oz (55.1 kg)    Physical Exam Vitals reviewed.  Constitutional:      Appearance: Normal appearance.  HENT:     Mouth/Throat:     Mouth: Mucous membranes are moist.  Eyes:     General: No scleral icterus.    Conjunctiva/sclera: Conjunctivae normal.  Cardiovascular:     Rate and Rhythm: Normal rate and regular rhythm.     Heart sounds: No murmur heard.    No friction rub. No gallop.     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     Breath sounds: No stridor. No wheezing, rhonchi or rales.  Abdominal:     General: Abdomen is flat.     Palpations: There is no mass.     Tenderness: There is no abdominal tenderness. There is no guarding.     Hernia: No hernia is present.  Musculoskeletal:     Cervical back: Neck supple.     Right lower leg: No edema.     Left lower leg: No edema.  Lymphadenopathy:     Cervical: No cervical adenopathy.  Skin:    General: Skin is warm and dry.  Neurological:     General: No focal deficit present.     Mental Status: She is alert. Mental status is at baseline.  Psychiatric:        Mood and Affect: Mood normal.         Behavior: Behavior normal.     Lab Results  Component Value Date   WBC 4.3 03/01/2023   HGB 13.2 03/01/2023   HCT 39.3 03/01/2023   PLT 243.0 03/01/2023   GLUCOSE 90 03/01/2023   CHOL 193 01/04/2023   TRIG 128 01/04/2023   HDL 52 01/04/2023   LDLCALC 118 (H) 01/04/2023   ALT 10 03/01/2023   AST 17 03/01/2023   NA 137 03/01/2023   K 4.6 03/01/2023   CL 103 03/01/2023   CREATININE 1.07 03/01/2023   BUN 21 03/01/2023   CO2 26 03/01/2023   TSH 1.70 03/01/2023   INR 1.0 05/06/2020   HGBA1C 5.7 11/15/2020    No results found.  Assessment & Plan:   Essential hypertension- EKG is negative for LVH. BP is adequately well controlled. -     Basic metabolic panel; Future -     CBC with Differential/Platelet; Future -     EKG 12-Lead  Stage 3b chronic kidney disease (HCC)- Her renal function is stable. Will avoid nephrotoxic agents  -     Basic metabolic panel; Future  Hyperlipidemia LDL goal <160 -     TSH; Future -     Hepatic function panel; Future  Heterozygous familial hypercholesterolemia -     TSH; Future -     Hepatic function panel; Future  Encounter for general adult medical examination with abnormal findings - Exam completed, labs reviewed, vaccines reviewed, cancer screenings addressed, pt ed material was given.      Follow-up: Return in about 6 months (around 08/29/2023).  Debby Molt, MD

## 2023-03-01 NOTE — Patient Instructions (Signed)

## 2023-03-02 ENCOUNTER — Encounter: Payer: Self-pay | Admitting: Internal Medicine

## 2023-03-15 ENCOUNTER — Other Ambulatory Visit: Payer: Medicare PPO

## 2023-03-15 ENCOUNTER — Other Ambulatory Visit: Payer: Medicare PPO | Admitting: Obstetrics and Gynecology

## 2023-03-18 ENCOUNTER — Encounter: Payer: Self-pay | Admitting: Internal Medicine

## 2023-03-19 ENCOUNTER — Encounter: Payer: Self-pay | Admitting: Cardiovascular Disease

## 2023-04-11 ENCOUNTER — Other Ambulatory Visit: Payer: Self-pay | Admitting: Internal Medicine

## 2023-04-11 DIAGNOSIS — I10 Essential (primary) hypertension: Secondary | ICD-10-CM

## 2023-04-11 DIAGNOSIS — E876 Hypokalemia: Secondary | ICD-10-CM

## 2023-04-23 ENCOUNTER — Ambulatory Visit: Payer: Medicare PPO | Admitting: Family

## 2023-04-23 ENCOUNTER — Encounter: Payer: Self-pay | Admitting: Family

## 2023-04-23 VITALS — BP 115/75 | HR 67 | Temp 97.8°F | Resp 16 | Ht 59.5 in | Wt 124.0 lb

## 2023-04-23 DIAGNOSIS — I10 Essential (primary) hypertension: Secondary | ICD-10-CM

## 2023-04-23 DIAGNOSIS — R311 Benign essential microscopic hematuria: Secondary | ICD-10-CM

## 2023-04-23 DIAGNOSIS — E785 Hyperlipidemia, unspecified: Secondary | ICD-10-CM

## 2023-04-23 DIAGNOSIS — H93A1 Pulsatile tinnitus, right ear: Secondary | ICD-10-CM | POA: Diagnosis not present

## 2023-04-23 DIAGNOSIS — I7121 Aneurysm of the ascending aorta, without rupture: Secondary | ICD-10-CM

## 2023-04-23 DIAGNOSIS — N1832 Chronic kidney disease, stage 3b: Secondary | ICD-10-CM | POA: Diagnosis not present

## 2023-04-23 LAB — BASIC METABOLIC PANEL
BUN: 19 mg/dL (ref 6–23)
CO2: 26 meq/L (ref 19–32)
Calcium: 9.3 mg/dL (ref 8.4–10.5)
Chloride: 104 meq/L (ref 96–112)
Creatinine, Ser: 0.92 mg/dL (ref 0.40–1.20)
GFR: 62.57 mL/min (ref >60.00)
Glucose, Bld: 88 mg/dL (ref 70–99)
Potassium: 4.2 meq/L (ref 3.5–5.1)
Sodium: 140 meq/L (ref 135–145)

## 2023-04-23 MED ORDER — TELMISARTAN 40 MG PO TABS: 40.0000 mg | ORAL_TABLET | Freq: Every day | ORAL | 1 refills | Status: DC

## 2023-04-23 NOTE — Assessment & Plan Note (Signed)
  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 Previous extensive imaging workup including MRA was normal. 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 No carotid bruit detected on examination. -No further imaging or referral to neurology recommended at this time as previous workup was extensive and normal. -Continue use of hearing aid during the day.

## 2023-04-23 NOTE — Assessment & Plan Note (Addendum)
 Lab Results  Component Value Date   CHOL 193 01/04/2023   HDL 52 01/04/2023   LDLCALC 118 (H) 01/04/2023   TRIG 128 01/04/2023   CHOLHDL 3.7 01/04/2023   Patient is maintained on Leqvio injection. Lipids acceptable. Monitor.

## 2023-04-23 NOTE — Assessment & Plan Note (Addendum)
 BP Readings from Last 3 Encounters:  04/23/23 115/75  03/01/23 132/86  01/17/23 118/78  Initial reading was elevated but repeat reading was OK, Continue low dose telmisartan. Home readings are good.

## 2023-04-23 NOTE — Assessment & Plan Note (Signed)
 Has been stable, last CT 1 year ago. Recommended that she repeat this year, she declines and wishes to wait until 2026.

## 2023-04-23 NOTE — Progress Notes (Signed)
 Subjective:     Patient ID: Monica Lara, female    DOB: 1950/11/29, 73 y.o.   MRN: [id number]  Chief Complaint  Patient presents with   Transitions Of Care    HPI  Discussed the use of AI scribe software for clinical note transcription with the patient, who gave verbal consent to proceed.  History of Present Illness   Monica Lara is a 73 year old female with hypertension who presents with persistent pulsatile tinnitus in the right ear. She was referred by Dr. Yetta Barre for further evaluation of her symptoms.  She has been experiencing persistent pulsatile tinnitus in her right ear for the past five to six years, which began after an increase in her lisinopril dosage. The sound is described as her heartbeat in her ear, varying in intensity and most noticeable at night when it is quiet. Extensive imaging, including MRI and MRA, showed normal results. An ENT consultation did not identify a cause or solution. She also reports some hearing loss in the affected ear and uses a hearing aid, which helps during the day but cannot be worn at night.  She has a history of hypertension and is currently taking telmisartan 40 mg. Her blood pressure is generally well-controlled with home monitoring, though it occasionally spikes. She has experienced side effects from other antihypertensive medications, including lisinopril, spironolactone, and amlodipine, leading to changes in her medication regimen.  She has a history of microscopic hematuria, which has been evaluated by a urologist without any significant findings. The condition is stable and under observation.  She has a known ascending thoracic aortic aneurysm measuring 3.8 cm, which has been stable and is monitored regularly.  She has a history of intolerance to statins and is not currently on any cholesterol-lowering medications. She has experienced side effects from various medications in the past, including muscle cramps and breast tenderness.  Her  family history includes diabetes, hypertension, and heart failure in her father, a perforated bowel in her mother, and hypertension in her brother. She has two healthy daughters. 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 She enjoys gardening, walking, and has a Development worker, international aid. She does not smoke, drink alcohol, or use drugs.          Health Maintenance Due  Topic Date Due   COVID-19 Vaccine (3 - 2024-25 season) 10/29/2022   MAMMOGRAM  03/11/2023   Medicare Annual Wellness (AWV)  05/23/2023    Past Medical History:  Diagnosis Date   Abnormal Pap smear of cervix    hpv many yrs ago   Elevated cholesterol    Hypertension     Past Surgical History:  Procedure Laterality Date   DILATION AND CURETTAGE OF UTERUS  2003   ESOPHAGOGASTRODUODENOSCOPY (EGD) WITH PROPOFOL N/A 05/04/2020   Procedure: ESOPHAGOGASTRODUODENOSCOPY (EGD) WITH PROPOFOL;  Surgeon: Napoleon Form, MD;  Location: [facility];  Service: Endoscopy;  Laterality: N/A;   GYNECOLOGIC CRYOSURGERY  1981   for abnormal pap smear many yrs ago   HIATAL HERNIA REPAIR N/A 05/06/2020   Procedure: LAPAROSCOPIC REPAIR OF HIATAL HERNIA;  Surgeon: Luretha Murphy, MD;  Location: [facility];  Service: General;  Laterality: N/A;   LAPAROSCOPIC NISSEN FUNDOPLICATION N/A 05/06/2020   Procedure: POSSIBLE LAPAROSCOPIC NISSEN FUNDOPLICATION;  Surgeon: Luretha Murphy, MD;  Location: [facility];  Service: General;  Laterality: N/A;    Family History  Problem Relation Age of Onset   Bowel  Disease Mother        perforated bowel- age 71   Diabetes Father    Hypertension Father    Congestive Heart Failure Father    Hypertension Brother    Pneumonia Paternal Grandmother        died during the May 28, 1916 flu pandemic    Social History   Socioeconomic History   Marital status: Single    Spouse name: Not on file   Number of children: Not on file   Years  of education: Not on file   Highest education level: Master's degree (e.g., MA, MS, MEng, MEd, MSW, MBA)  Occupational History   Not on file  Tobacco Use   Smoking status: Never   Smokeless tobacco: Never  Vaping Use   Vaping status: Never Used  Substance and Sexual Activity   Alcohol use: Never   Drug use: Never   Sexual activity: Not Currently    Partners: Male    Birth control/protection: Post-menopausal  Other Topics Concern   Not on file  Social History Narrative   Not married- divorced since 1998-05-29   Two grown daughters- one in [place] one in [place]   1 grandson in college and one who was born in 05-29-18   Retired Runner, 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, 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[place]   Enjoys Agricultural consultant work at [employer]   Social research officer, government, walking   Has a dog   Social Drivers of Corporate investment banker Strain: Low Risk  (02/22/2023)   Overall Financial Resource Strain (CARDIA)    Difficulty of Paying Living Expenses: Not very hard  Food Insecurity: No Food Insecurity (02/22/2023)   Hunger Vital Sign    Worried About Running Out of Food in the Last Year: Never true    Ran Out of Food in the Last Year: Never true  Transportation Needs: No Transportation Needs (02/22/2023)   PRAPARE - Administrator, Civil Service (Medical): No    Lack of Transportation (Non-Medical): No  Physical Activity: Sufficiently Active (02/22/2023)   Exercise Vital Sign    Days of Exercise per Week: 6 days    Minutes of Exercise per Session: 30 min  Stress: No Stress Concern Present (02/22/2023)   [employer] of Occupational Health - Occupational Stress Questionnaire    Feeling of Stress : Not at all  Social Connections: Moderately Integrated (02/22/2023)   Social Connection and Isolation Panel [NHANES]    Frequency of Communication with Friends and Family: More than three times a week    Frequency of Social Gatherings with Friends and Family: More than three times a week     Attends Religious Services: More than 4 times per year    Active Member of [employer] or Organizations: Yes    Attends Banker Meetings: More than 4 times per year    Marital Status: Divorced  Intimate Partner Violence: Not At Risk (05/23/2022)   Humiliation, Afraid, Rape, and Kick questionnaire    Fear of Current or Ex-Partner: No    Emotionally Abused: No    Physically Abused: No    Sexually Abused: No    Outpatient Medications Prior to Visit  Medication Sig Dispense Refill   inclisiran (LEQVIO) 284 MG/1.5ML SOSY injection      telmisartan (MICARDIS) 40 MG tablet TAKE 1 TABLET BY MOUTH EVERY DAY 90 tablet 0   fluticasone (FLONASE) 50 MCG/ACT nasal spray Place 2 sprays into both nostrils daily. 16 g 0  spironolactone (ALDACTONE) 25 MG tablet TAKE 1 TABLET (25 MG TOTAL) BY MOUTH DAILY. 90 tablet 0   No facility-administered medications prior to visit.    Allergies  Allergen Reactions   3-Methyl-2-Benzothiazolinone Hydrazone     Other reaction(s): Other   Ezetimibe Other (See Comments)    Other reaction(s): Myalgias (intolerance)   Sulfa Antibiotics Rash   Statins Other (See Comments)    intolarence    ROS See HPI    Objective:    Physical Exam Constitutional:      General: She is not in acute distress.    Appearance: Normal appearance. She is well-developed.  HENT:     Head: Normocephalic and atraumatic.     Right Ear: External ear normal.     Left Ear: External ear normal.  Eyes:     General: No scleral icterus. Neck:     Thyroid: No thyromegaly.  Cardiovascular:     Rate and Rhythm: Normal rate and regular rhythm.     Pulses:          Carotid pulses are 2+ on the right side and 2+ on the left side.    Heart sounds: Normal heart sounds. No murmur heard.    Comments: No carotid bruits noted Pulmonary:     Effort: Pulmonary effort is normal. No respiratory distress.     Breath sounds: Normal breath sounds. No wheezing.  Musculoskeletal:     Cervical  back: Neck supple.  Skin:    General: Skin is warm and dry.  Neurological:     Mental Status: She is alert and oriented to person, place, and time.  Psychiatric:        Mood and Affect: Mood normal.        Behavior: Behavior normal.        Thought Content: Thought content normal.        Judgment: Judgment normal.      BP 115/75   Pulse 67   Temp 97.8 F (36.6 C) (Oral)   Resp 16   Ht 4' 11.5" (1.511 m)   Wt 124 lb (56.2 kg)   SpO2 100%   BMI 24.63 kg/m  Wt Readings from Last 3 Encounters:  04/23/23 124 lb (56.2 kg)  03/01/23 122 lb 3.2 oz (55.4 kg)  01/17/23 122 lb (55.3 kg)       Assessment & Plan:   Problem List Items Addressed This Visit       Unprioritized   Thoracic ascending aortic aneurysm (HCC)   Has been stable, last CT 1 year ago. Recommended that she repeat this year, she declines and wishes to wait until 2026.       Relevant Medications   telmisartan (MICARDIS) 40 MG tablet   Stage 3b chronic kidney disease (HCC)   Lab Results  Component Value Date   CREATININE 1.07 03/01/2023   Was following at [facility]. States they have have signed off, just advised her to monitor with PCP.       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 Previous extensive imaging workup including MRA was normal. 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 No carotid bruit detected on examination. -No further imaging or referral to neurology recommended at this time as previous workup was extensive and normal. -Continue use of hearing aid during the day.      Hyperlipidemia   Lab Results  Component Value  Date   CHOL 193 01/04/2023   HDL 52 01/04/2023   LDLCALC 118 (H) 01/04/2023   TRIG 128 01/04/2023   CHOLHDL 3.7 01/04/2023   Patient is maintained on Leqvio injection. Lipids acceptable. Monitor.        Relevant Medications   telmisartan (MICARDIS) 40 MG tablet    Essential hypertension - Primary   BP Readings from Last 3 Encounters:  04/23/23 115/75  03/01/23 132/86  01/17/23 118/78  Initial reading was elevated but repeat reading was OK, Continue low dose telmisartan. Home readings are good.          Relevant Medications   telmisartan (MICARDIS) 40 MG tablet   Other Relevant Orders   Basic Metabolic Panel (BMET)   Benign essential microscopic hematuria   Reports that she has seen Dr. Pete Glatter and reports negative work up.         I have discontinued Quintasha Kerper's spironolactone and fluticasone. I have also changed her telmisartan. Additionally, I am having her maintain her Leqvio.  Meds ordered this encounter  Medications   telmisartan (MICARDIS) 40 MG tablet    Sig: Take 1 tablet (40 mg total) by mouth daily.    Dispense:  90 tablet    Refill:  1    Supervising Provider:   Danise Edge A [[id number]]

## 2023-04-23 NOTE — Assessment & Plan Note (Signed)
 Reports that she has seen Dr. Pete Glatter and reports negative work up.

## 2023-04-23 NOTE — Patient Instructions (Signed)
 VISIT SUMMARY:  Today, we discussed your ongoing health concerns, including your pulsatile tinnitus, hypertension, hyperlipidemia, chronic kidney disease, and ascending aortic aneurysm. We reviewed your current medications and recent test results, and we have made a plan to manage each of these issues moving forward.  YOUR PLAN:  -PULSATILE TINNITUS: Pulsatile tinnitus is a condition where you hear a rhythmic sound in your ear, often in time with your heartbeat. Your extensive imaging tests were normal, and no further imaging or neurology referral is needed at this time. Continue using your hearing aid during the day to help with hearing loss in the affected ear.  -HYPERTENSION: Hypertension, or high blood pressure, is well-controlled with your current medication, Telmisartan 40mg  daily. Continue taking this medication as prescribed.  -HYPERLIPIDEMIA: Hyperlipidemia means you have slightly elevated levels of LDL cholesterol. We will continue with your current management plan.  -CHRONIC KIDNEY DISEASE: Chronic kidney disease involves reduced kidney function. Your condition is stable, but we will check your potassium levels due to the recent discontinuation of Spironolactone.  -ASCENDING AORTIC ANEURYSM: An ascending aortic aneurysm is an enlargement of the upper part of the aorta. Your aneurysm is stable, and we will plan for repeat imaging in February 2026.  -GENERAL HEALTH MAINTENANCE: We administered your influenza vaccine today. Please schedule a physical examination in six months.  INSTRUCTIONS:  Please continue using your hearing aid during the day for your tinnitus. Keep taking Telmisartan 40mg  daily for your blood pressure. We will check your potassium levels soon. Plan for repeat imaging of your aortic aneurysm in February 2026. Schedule a physical examination in six months.

## 2023-04-23 NOTE — Assessment & Plan Note (Signed)
 Lab Results  Component Value Date   CREATININE 1.07 03/01/2023   Was following at [facility]. States they have have signed off, just advised her to monitor with PCP.

## 2023-04-26 ENCOUNTER — Telehealth: Payer: Self-pay

## 2023-04-26 NOTE — Telephone Encounter (Signed)
 Auth Submission: APPROVED - Renewal Site of care: Site of care: [facility] Payer: [employer] medicare Medication & CPT/J Code(s) submitted: Leqvio (Inclisiran) [id number] Route of submission (phone, fax, portal): portal Phone # Fax # Auth type: Buy/Bill PB Units/visits requested: 284mg  x 2 doses Reference number: [id number] Approval from: 04/26/23 to 02/27/24

## 2023-06-18 ENCOUNTER — Ambulatory Visit (INDEPENDENT_AMBULATORY_CARE_PROVIDER_SITE_OTHER): Payer: Medicare PPO | Admitting: *Deleted

## 2023-06-18 VITALS — BP 146/83 | HR 71 | Temp 97.9°F | Resp 16 | Ht 60.0 in | Wt 125.0 lb

## 2023-06-18 DIAGNOSIS — E7801 Familial hypercholesterolemia: Secondary | ICD-10-CM | POA: Diagnosis not present

## 2023-06-18 MED ORDER — INCLISIRAN SODIUM 284 MG/1.5ML ~~LOC~~ SOSY
284.0000 mg | PREFILLED_SYRINGE | Freq: Once | SUBCUTANEOUS | Status: AC
  Administered 2023-06-18: 284 mg via SUBCUTANEOUS
  Filled 2023-06-18: qty 1.5

## 2023-06-18 NOTE — Progress Notes (Signed)
 Diagnosis: Hyperlipidemia  Provider:  Chilton Greathouse MD  Procedure: Injection  Leqvio (inclisiran), Dose: 284 mg, Site: subcutaneous, Number of injections: 1  Injection Site(s): Left arm  Post Care: Observation period completed  Discharge: Condition: Good, Destination: Home . AVS Provided  Performed by:  Forrest Moron, RN

## 2023-07-24 ENCOUNTER — Other Ambulatory Visit: Payer: Self-pay | Admitting: Family

## 2023-07-24 DIAGNOSIS — I10 Essential (primary) hypertension: Secondary | ICD-10-CM

## 2023-07-26 IMAGING — US US PELVIS COMPLETE WITH TRANSVAGINAL
1 series · 13 of 25 positions shown · non-contrast
Comparison: CT chest abdomen pelvis 05/03/2020

CLINICAL DATA: RIGHT ovarian mass, abnormal CT; postmenopausal

EXAM:
TRANSABDOMINAL AND TRANSVAGINAL ULTRASOUND OF PELVIS
TECHNIQUE: Both transabdominal and transvaginal ultrasound examinations of the
pelvis were performed. Transabdominal technique was performed for
global imaging of the pelvis including uterus, ovaries, adnexal
regions, and pelvic cul-de-sac. It was necessary to proceed with
endovaginal exam following the transabdominal exam to visualize the
endometrium and ovaries.

[Series 1: us pelvis complete with transvaginal · 0.20mm/px · 13 of 58 slices shown]
[im 1/58]
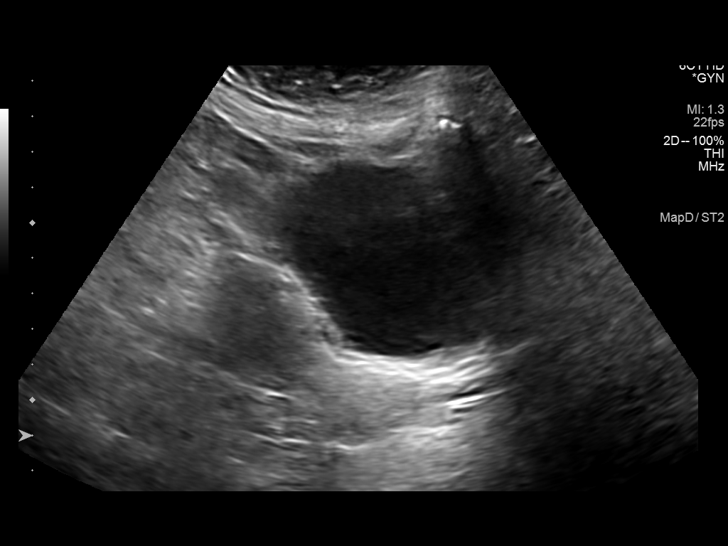
[im 5/58]
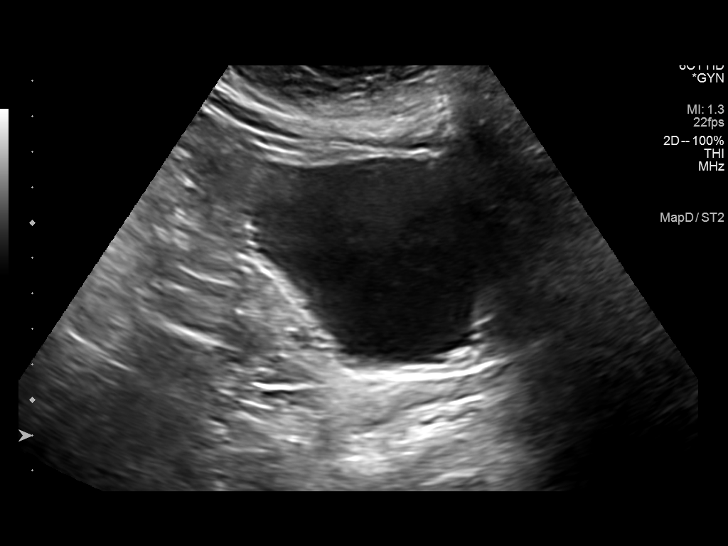
[im 10/58]
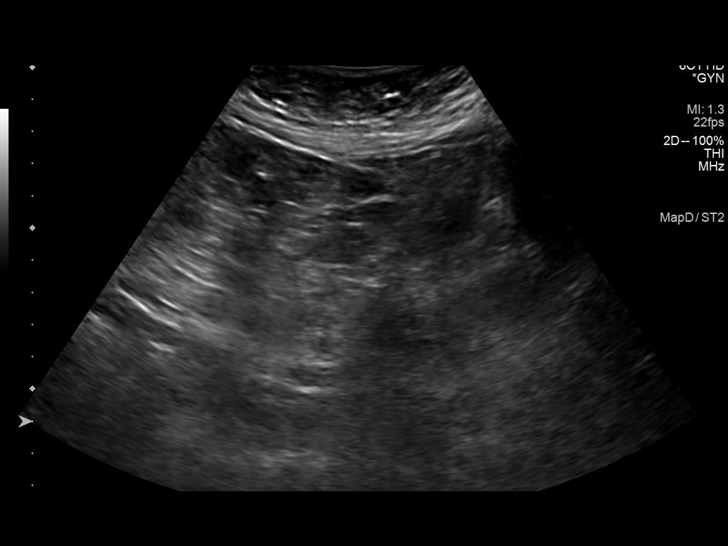
[im 15/58]
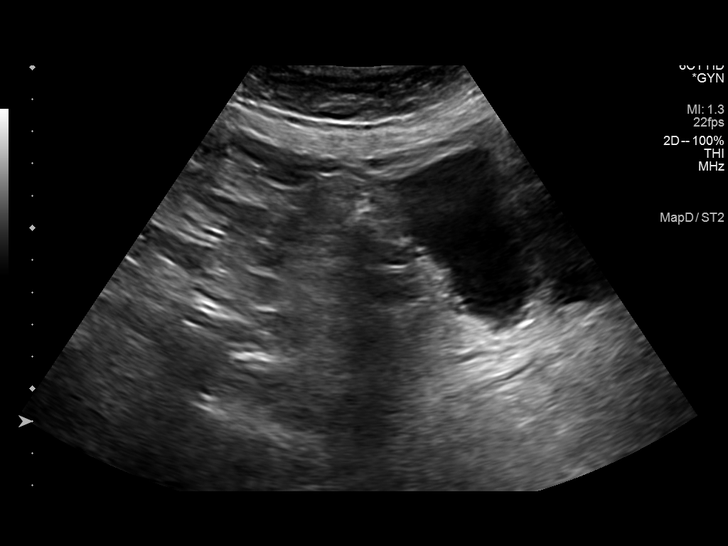
[im 20/58]
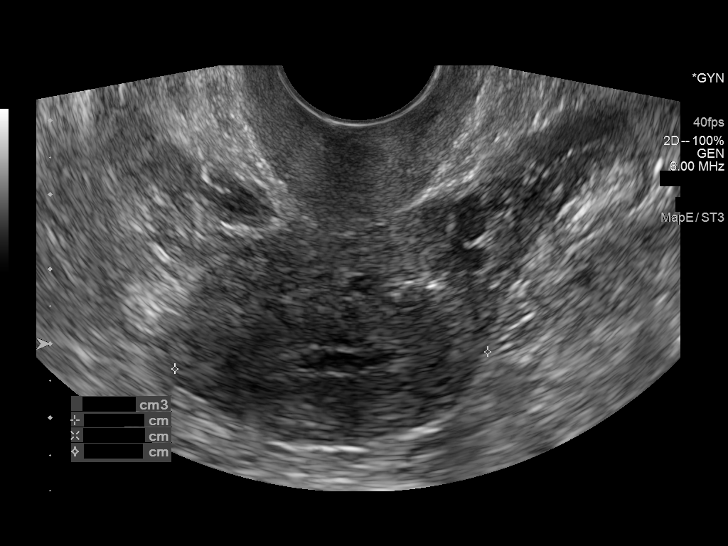
[im 24/58]
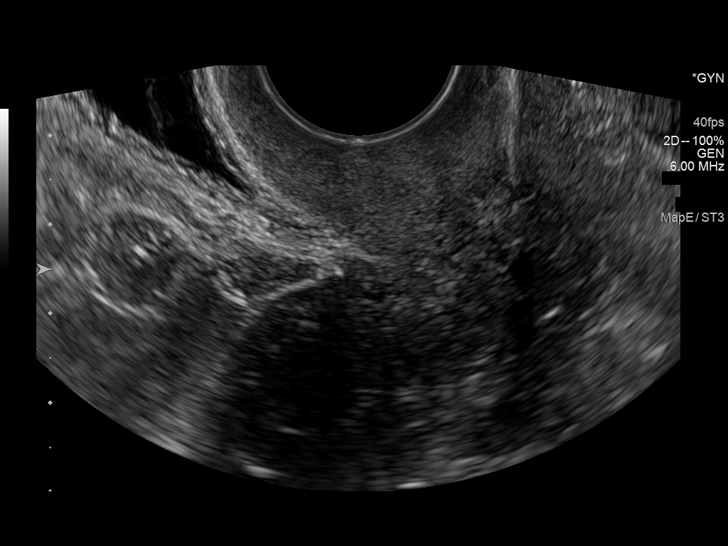
[im 29/58]
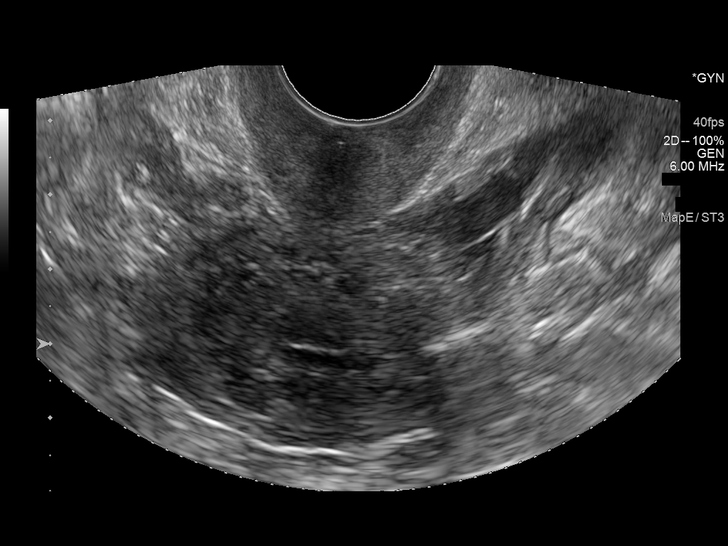
[im 34/58]
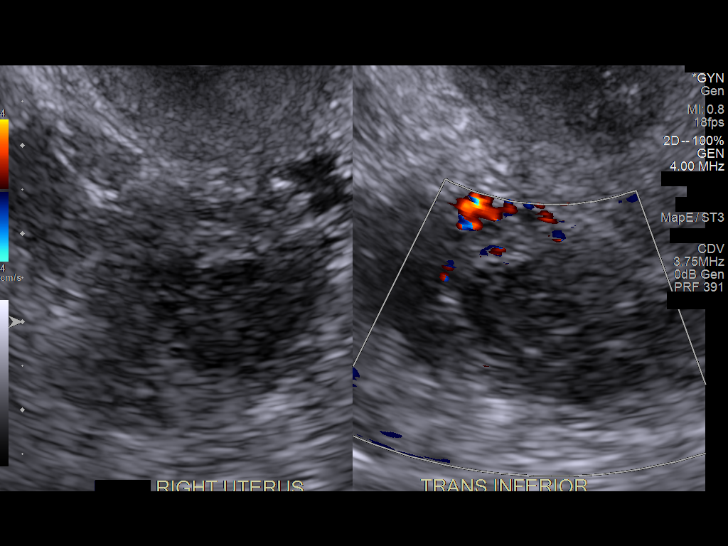
[im 39/58]
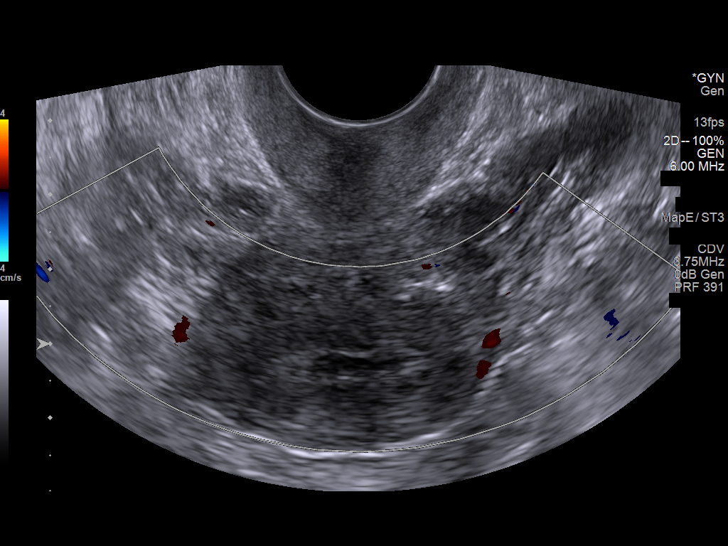
[im 43/58]
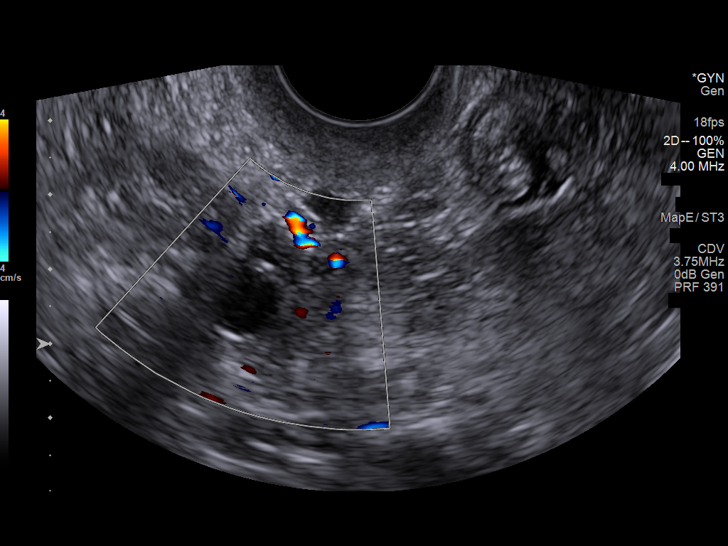
[im 48/58]
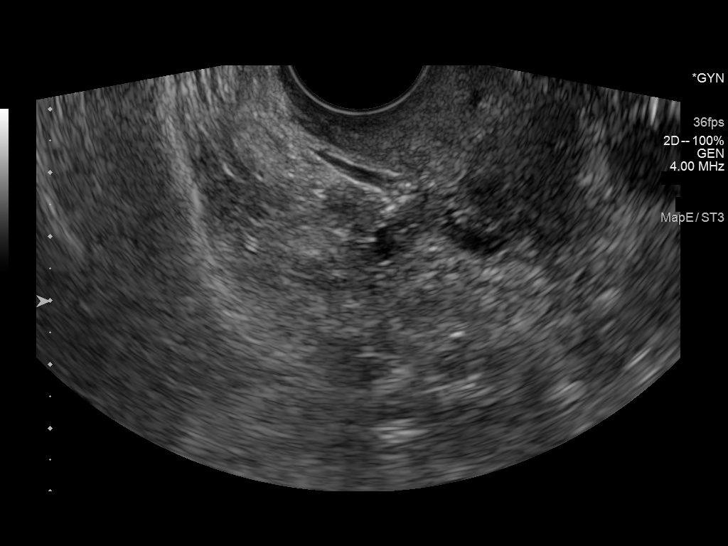
[im 53/58]
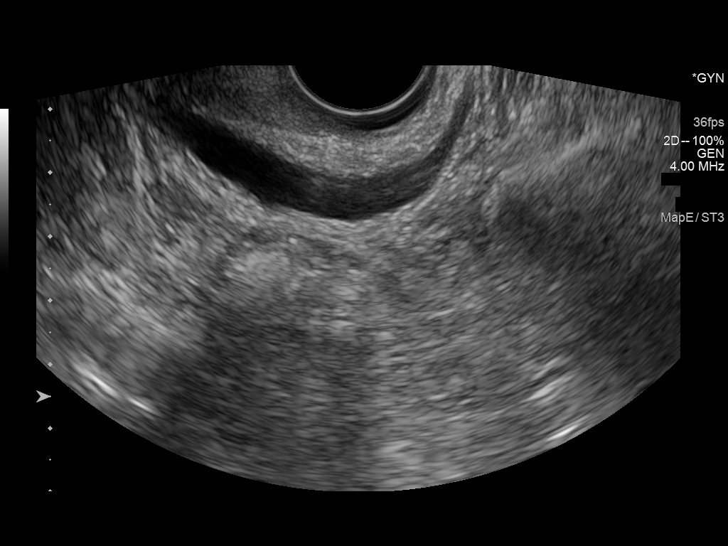
[im 58/58]
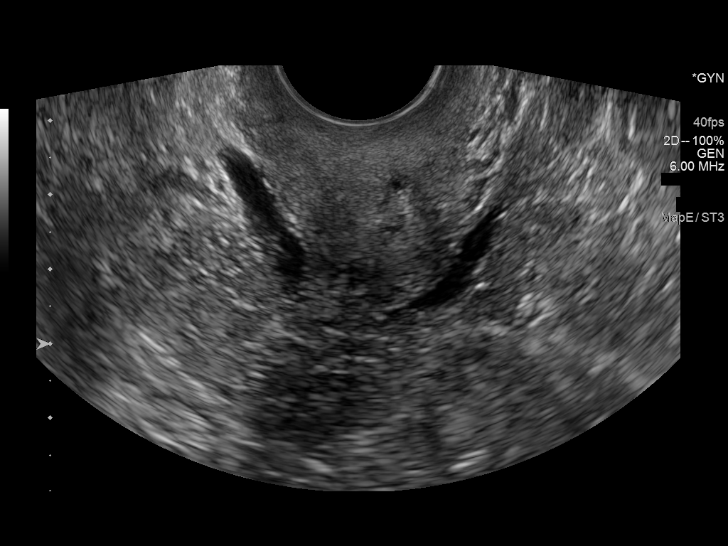

[13 of 25 positions shown; findings below may reference images not displayed]

FINDINGS: Uterus

Measurements: 7.2 x 3.6 x 4.4 cm = volume: 60 mL. Anteverted.
Heterogeneous myometrium. Probable hypoechoic intramural leiomyoma
15 x 11 x 11 mm intramural RIGHT uterus. Additional subserosal
leiomyoma LEFT lateral uterus 19 x 13 x 12 mm.

Endometrium

Thickness: 2 mm. Small amount of nonspecific endometrial fluid. No
discrete mass.

Right ovary

Measurements: 1.9 x 1.7 x 1.3 cm = volume: 2 mL. Small simple cyst
RIGHT ovary 9 x 10 x 8 mm decreased in size from previous CT exam;
no follow-up imaging is recommended. Reference: Radiology [DATE]):[id number]

Left ovary

Not visualized, likely obscured by bowel

Other findings

No free pelvic fluid.  No additional adnexal masses.
IMPRESSION: 2 small uterine leiomyomata measuring 19 mm and 15 mm in greatest
sizes.

Interval decrease in size of RIGHT ovarian cyst now 10 mm diameter,
no follow-up imaging recommended.

Small amount of nonspecific endometrial fluid.

Nonvisualization of LEFT ovary.

## 2023-08-29 ENCOUNTER — Ambulatory Visit: Payer: Medicare PPO | Admitting: Internal Medicine

## 2023-10-23 ENCOUNTER — Ambulatory Visit: Payer: Self-pay | Admitting: Family

## 2023-10-23 ENCOUNTER — Ambulatory Visit: Payer: Medicare PPO | Admitting: Family

## 2023-10-23 VITALS — BP 133/66 | HR 66 | Temp 97.6°F | Resp 16 | Ht 60.0 in | Wt 125.8 lb

## 2023-10-23 DIAGNOSIS — H93A1 Pulsatile tinnitus, right ear: Secondary | ICD-10-CM | POA: Diagnosis not present

## 2023-10-23 DIAGNOSIS — N1832 Chronic kidney disease, stage 3b: Secondary | ICD-10-CM | POA: Diagnosis not present

## 2023-10-23 DIAGNOSIS — I7121 Aneurysm of the ascending aorta, without rupture: Secondary | ICD-10-CM

## 2023-10-23 DIAGNOSIS — E785 Hyperlipidemia, unspecified: Secondary | ICD-10-CM | POA: Diagnosis not present

## 2023-10-23 DIAGNOSIS — I10 Essential (primary) hypertension: Secondary | ICD-10-CM | POA: Diagnosis not present

## 2023-10-23 LAB — BASIC METABOLIC PANEL WITH GFR
BUN: 19 mg/dL (ref 6–23)
CO2: 27 meq/L (ref 19–32)
Calcium: 9.3 mg/dL (ref 8.4–10.5)
Chloride: 103 meq/L (ref 96–112)
Creatinine, Ser: 0.92 mg/dL (ref 0.40–1.20)
GFR: 62.35 mL/min
Glucose, Bld: 89 mg/dL (ref 70–99)
Potassium: 4.6 meq/L (ref 3.5–5.1)
Sodium: 140 meq/L (ref 135–145)

## 2023-10-23 LAB — LIPID PANEL
Cholesterol: 201 mg/dL — ABNORMAL HIGH (ref 0–200)
HDL: 59.4 mg/dL (ref >39.00)
LDL Cholesterol: 121 mg/dL — ABNORMAL HIGH (ref 0–99)
NonHDL: 141.2
Total CHOL/HDL Ratio: 3
Triglycerides: 103 mg/dL (ref 0.0–149.0)
VLDL: 20.6 mg/dL (ref 0.0–40.0)

## 2023-10-23 NOTE — Assessment & Plan Note (Signed)
 Wants to wait until after the new year and prefers US  over CT due to not liking the CT contrast.

## 2023-10-23 NOTE — Progress Notes (Signed)
 Subjective:     Patient ID: Monica Lara, female    DOB: 07/10/1950, 73 y.o.   MRN: [id number]  Chief Complaint  Patient presents with   Hypertension    Here for follow up    Hypertension    Discussed the use of AI scribe software for clinical note transcription with the patient, who gave verbal consent to proceed.  History of Present Illness  Monica Lara is a 73 year old female who presents for a follow-up visit.  She manages her cholesterol with Lecfio injections, with satisfactory levels checked nine months ago. She is interested in monitoring her cholesterol and potassium levels due to past issues. She has a history of kidney issues and was advised by her nephrologist to follow up with her primary care provider to monitor kidney function. She takes Micardis  40 mg for blood pressure management, with home readings generally around 113/65 mmHg using multiple consistent machines. She has a history of thoracic aortic aneurysm, last evaluated with a CT scan in March 2024, and prefers to delay further imaging until Nov 07, 2024, considering an ultrasound as an alternative. She uses a hearing aid for tinnitus, with no current concerns. She is reluctant to undergo further imaging after a mammogram in 11-07-2021 due to exposure concerns.     Health Maintenance Due  Topic Date Due   Zoster Vaccines- Shingrix (2 of 2) 04/22/2020   COVID-19 Vaccine (3 - 2024-25 season) Nov 08, 2022   MAMMOGRAM  03/11/2023   Medicare Annual Wellness (AWV)  05/23/2023   INFLUENZA VACCINE  09/28/2023    Past Medical History:  Diagnosis Date   Abnormal Pap smear of cervix    hpv many yrs ago   Elevated cholesterol    Hypertension     Past Surgical History:  Procedure Laterality Date   DILATION AND CURETTAGE OF UTERUS  11-07-01   ESOPHAGOGASTRODUODENOSCOPY (EGD) WITH PROPOFOL  N/A 05/04/2020   Procedure: ESOPHAGOGASTRODUODENOSCOPY (EGD) WITH PROPOFOL ;  Surgeon: Shila Gustav GAILS, MD;  Location: [facility];  Service:  Endoscopy;  Laterality: N/A;   GYNECOLOGIC CRYOSURGERY  1981   for abnormal pap smear many yrs ago   HIATAL HERNIA REPAIR N/A 05/06/2020   Procedure: LAPAROSCOPIC REPAIR OF HIATAL HERNIA;  Surgeon: Gladis Cough, MD;  Location: [facility];  Service: General;  Laterality: N/A;   LAPAROSCOPIC NISSEN FUNDOPLICATION N/A 05/06/2020   Procedure: POSSIBLE LAPAROSCOPIC NISSEN FUNDOPLICATION;  Surgeon: Gladis Cough, MD;  Location: [facility];  Service: General;  Laterality: N/A;    Family History  Problem Relation Age of Onset   Bowel Disease Mother        perforated bowel- age 61   Diabetes Father    Hypertension Father    Congestive Heart Failure Father    Hypertension Brother    Pneumonia Paternal Grandmother        died during the 1916-11-07 flu pandemic    Social History   Socioeconomic History   Marital status: Single    Spouse name: Not on file   Number of children: Not on file   Years of education: Not on file   Highest education level: Master's degree (e.g., MA, MS, MEng, MEd, MSW, MBA)  Occupational History   Not on file  Tobacco Use   Smoking status: Never   Smokeless tobacco: Never  Vaping Use   Vaping status: Never Used  Substance and Sexual Activity   Alcohol use: Never   Drug use: Never   Sexual activity: Not Currently    Partners: Male  Birth control/protection: Post-menopausal  Other Topics Concern   Not on file  Social History Narrative   Not married- divorced since 2000   Two grown daughters- one in [place] one in [place]   1 grandson in college and one who was born in 2020   Retired Runner, 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, 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[place], [place]   Enjoys Agricultural consultant work at [employer]   Social research officer, government, walking   Has a dog   Social Drivers of Health   Financial Resource Strain: Patient Declined (10/16/2023)   Overall Financial Resource Strain (CARDIA)    Difficulty of Paying Living Expenses: Patient declined  Food Insecurity: Patient Declined (10/16/2023)    Hunger Vital Sign    Worried About Running Out of Food in the Last Year: Patient declined    Ran Out of Food in the Last Year: Patient declined  Transportation Needs: No Transportation Needs (10/16/2023)   PRAPARE - Administrator, Civil Service (Medical): No    Lack of Transportation (Non-Medical): No  Physical Activity: Sufficiently Active (10/16/2023)   Exercise Vital Sign    Days of Exercise per Week: 5 days    Minutes of Exercise per Session: 30 min  Stress: No Stress Concern Present (10/16/2023)   [employer] of Occupational Health - Occupational Stress Questionnaire    Feeling of Stress: Not at all  Social Connections: Moderately Integrated (10/16/2023)   Social Connection and Isolation Panel    Frequency of Communication with Friends and Family: More than three times a week    Frequency of Social Gatherings with Friends and Family: Three times a week    Attends Religious Services: More than 4 times per year    Active Member of Clubs or Organizations: Yes    Attends Banker Meetings: More than 4 times per year    Marital Status: Divorced  Intimate Partner Violence: Not At Risk (05/23/2022)   Humiliation, Afraid, Rape, and Kick questionnaire    Fear of Current or Ex-Partner: No    Emotionally Abused: No    Physically Abused: No    Sexually Abused: No    Outpatient Medications Prior to Visit  Medication Sig Dispense Refill   inclisiran (LEQVIO ) 284 MG/1.5ML SOSY injection      telmisartan  (MICARDIS ) 40 MG tablet TAKE 1 TABLET BY MOUTH EVERY DAY 90 tablet 1   No facility-administered medications prior to visit.    Allergies  Allergen Reactions   3-Methyl-2-Benzothiazolinone Hydrazone     Other reaction(s): Other   Ezetimibe Other (See Comments)    Other reaction(s): Myalgias (intolerance)   Sulfa Antibiotics Rash   Statins Other (See Comments)    intolarence    ROS    See HPI Objective:    Physical Exam Constitutional:      General:  She is not in acute distress.    Appearance: Normal appearance. She is well-developed.  HENT:     Head: Normocephalic and atraumatic.     Right Ear: External ear normal.     Left Ear: External ear normal.  Eyes:     General: No scleral icterus. Neck:     Thyroid : No thyromegaly.  Cardiovascular:     Rate and Rhythm: Normal rate and regular rhythm.     Heart sounds: Normal heart sounds. No murmur heard. Pulmonary:     Effort: Pulmonary effort is normal. No respiratory distress.     Breath sounds: Normal breath sounds. No wheezing.  Musculoskeletal:  Cervical back: Neck supple.  Skin:    General: Skin is warm and dry.  Neurological:     Mental Status: She is alert and oriented to person, place, and time.  Psychiatric:        Mood and Affect: Mood normal.        Behavior: Behavior normal.        Thought Content: Thought content normal.        Judgment: Judgment normal.      BP 133/66 (BP Location: Right Arm, Patient Position: Sitting, Cuff Size: Small)   Pulse 66   Temp 97.6 F (36.4 C) (Oral)   Resp 16   Ht 5' (1.524 m)   Wt 125 lb 12.8 oz (57.1 kg)   SpO2 100%   BMI 24.57 kg/m  Wt Readings from Last 3 Encounters:  10/23/23 125 lb 12.8 oz (57.1 kg)  06/18/23 125 lb (56.7 kg)  04/23/23 124 lb (56.2 kg)       Assessment & Plan:   Problem List Items Addressed This Visit       Unprioritized   Thoracic ascending aortic aneurysm (HCC)   Wants to wait until after the new year and prefers US  over CT due to not liking the CT contrast.       Stage 3b chronic kidney disease (HCC)   Last renal function was normal.  Update today.      Pulsatile tinnitus of right ear   Unchanged. Continues to wear a hearing aid.       Hyperlipidemia   On Leqvio .  Update lipid panel.       Relevant Orders   Lipid panel   Essential hypertension - Primary   BP stable on micardis .       Relevant Orders   Basic Metabolic Panel (BMET)   Declines mammogram. Counseled on the  importance of routine screening and early detection.   I am having Monica Lara maintain her Leqvio  and telmisartan .  No orders of the defined types were placed in this encounter.

## 2023-10-23 NOTE — Assessment & Plan Note (Signed)
 BP stable on micardis .

## 2023-10-23 NOTE — Patient Instructions (Signed)
 VISIT SUMMARY:  Today, we reviewed your current health status and ongoing treatments. We discussed your thoracic aortic aneurysm, chronic kidney disease, hypertension, and hyperlipidemia. We also talked about general health maintenance, including the importance of flu vaccination.  YOUR PLAN:  THORACIC AORTIC ANEURYSM: You have a thoracic aortic aneurysm that was last checked with a CT scan. You prefer to delay further imaging until 2026. -We will schedule an ultrasound to check your thoracic aortic aneurysm after the holidays in 2026.  CHRONIC KIDNEY DISEASE: You have chronic kidney disease, which is being monitored by your nephrologist. -We will order kidney function tests today to monitor your condition.  ESSENTIAL HYPERTENSION: Your blood pressure is well-controlled with Micardis  40 mg. -We will ensure that you have enough Micardis  refills available through November.  HYPERLIPIDEMIA: Your cholesterol levels are managed with Lecfio injections and were last checked nine months ago. -Continue with your current Lecfio injections to manage your cholesterol levels.  GENERAL HEALTH MAINTENANCE: We discussed the benefits of mammography and the importance of flu vaccination. -Please get your flu vaccination at the pharmacy this fall.

## 2023-10-23 NOTE — Assessment & Plan Note (Signed)
 On Leqvio .  Update lipid panel.

## 2023-10-23 NOTE — Assessment & Plan Note (Signed)
 Last renal function was normal.  Update today.

## 2023-10-23 NOTE — Assessment & Plan Note (Signed)
 Unchanged. Continues to wear a hearing aid.

## 2023-10-31 ENCOUNTER — Encounter: Payer: Self-pay | Admitting: Pharmacist Clinician (PhC)/ Clinical Pharmacy Specialist

## 2023-11-13 ENCOUNTER — Ambulatory Visit (INDEPENDENT_AMBULATORY_CARE_PROVIDER_SITE_OTHER)

## 2023-11-13 VITALS — Ht 60.0 in | Wt 125.0 lb

## 2023-11-13 DIAGNOSIS — Z0000 Encounter for general adult medical examination without abnormal findings: Secondary | ICD-10-CM

## 2023-11-13 NOTE — Patient Instructions (Signed)
 Ms. Robben,  Thank you for taking the time for your Medicare Wellness Visit. I appreciate your continued commitment to your health goals. Please review the care plan we discussed, and feel free to reach out if I can assist you further.  Medicare recommends these wellness visits once per year to help you and your care team stay ahead of potential health issues. These visits are designed to focus on prevention, allowing your provider to concentrate on managing your acute and chronic conditions during your regular appointments.  Please note that Annual Wellness Visits do not include a physical exam. Some assessments may be limited, especially if the visit was conducted virtually. If needed, we may recommend a separate in-person follow-up with your provider.  Ongoing Care Seeing your primary care provider every 3 to 6 months helps us  monitor your health and provide consistent, personalized care.   Referrals If a referral was made during today's visit and you haven't received any updates within two weeks, please contact the referred provider directly to check on the status.  Recommended Screenings:  Health Maintenance  Topic Date Due   Zoster (Shingles) Vaccine (2 of 2) 04/22/2020   Breast Cancer Screening  03/11/2023   Flu Shot  09/28/2023   COVID-19 Vaccine (3 - 2025-26 season) 10/29/2023   DEXA scan (bone density measurement)  02/29/2024*   Cologuard (Stool DNA test)  11/23/2023   Medicare Annual Wellness Visit  11/12/2024   DTaP/Tdap/Td vaccine (6 - Td or Tdap) 10/21/2029   Pneumococcal Vaccine for age over 29  Completed   Hepatitis C Screening  Completed   HPV Vaccine  Aged Out   Meningitis B Vaccine  Aged Out   Hepatitis B Vaccine  Discontinued  *Topic was postponed. The date shown is not the original due date.       11/13/2023   10:45 AM  Advanced Directives  Does Patient Have a Medical Advance Directive? No  Would patient like information on creating a medical advance  directive? Yes (MAU/Ambulatory/Procedural Areas - Information given)   Advance Care Planning is important because it: Ensures you receive medical care that aligns with your values, goals, and preferences. Provides guidance to your family and loved ones, reducing the emotional burden of decision-making during critical moments.  Information on Advanced Care Planning can be found at   Secretary of [facility] Advance Health Care Directives Advance Health Care Directives ([email or web address]   Vision: Annual vision screenings are recommended for early detection of glaucoma, cataracts, and diabetic retinopathy. These exams can also reveal signs of chronic conditions such as diabetes and high blood pressure.  Dental: Annual dental screenings help detect early signs of oral cancer, gum disease, and other conditions linked to overall health, including heart disease and diabetes.  Please see the attached documents for additional preventive care recommendations.

## 2023-11-13 NOTE — Progress Notes (Signed)
 Subjective:   Monica Lara is a 73 y.o. who presents for a Medicare Wellness preventive visit.  As a reminder, Annual Wellness Visits don't include a physical exam, and some assessments may be limited, especially if this visit is performed virtually. We may recommend an in-person follow-up visit with your provider if needed.  Visit Complete: Virtual I connected with  [name] on 11/13/23 by a audio enabled telemedicine application and verified that I am speaking with the correct person using two identifiers.  Patient Location: Home  Provider Location: Home Office  I discussed the limitations of evaluation and management by telemedicine. The patient expressed understanding and agreed to proceed.  Vital Signs: Because this visit was a virtual/telehealth visit, some criteria may be missing or patient reported. Any vitals not documented were not able to be obtained and vitals that have been documented are patient reported.  VideoDeclined- This patient declined Librarian, academic. Therefore the visit was completed with audio only.  Persons Participating in Visit: Patient.  AWV Questionnaire: Yes: Patient Medicare AWV questionnaire was completed by the patient on 11/06/23; I have confirmed that all information answered by patient is correct and no changes since this date.  Cardiac Risk Factors include: advanced age (>80men, >94 women);hypertension;dyslipidemia     Objective:    Today's Vitals   11/13/23 1043  Weight: 125 lb (56.7 kg)  Height: 5' (1.524 m)   Body mass index is 24.41 kg/m.     11/13/2023   10:45 AM 05/23/2022    9:05 PM 05/10/2022    5:48 PM 06/03/2020    3:38 AM 05/06/2020    9:12 AM 05/04/2020   10:47 AM 05/03/2020    4:11 PM  Advanced Directives  Does Patient Have a Medical Advance Directive? No No No No No No No  Would patient like information on creating a medical advance directive? Yes (MAU/Ambulatory/Procedural Areas - Information  given) No - Patient declined  No - Patient declined No - Patient declined No - Patient declined No - Patient declined    Current Medications (verified) Outpatient Encounter Medications as of 11/13/2023  Medication Sig   inclisiran (LEQVIO ) 284 MG/1.5ML SOSY injection    telmisartan  (MICARDIS ) 40 MG tablet TAKE 1 TABLET BY MOUTH EVERY DAY   No facility-administered encounter medications on file as of 11/13/2023.    Allergies (verified) 3-methyl-2-benzothiazolinone hydrazone, Ezetimibe, Sulfa antibiotics, and Statins   History: Past Medical History:  Diagnosis Date   Abnormal Pap smear of cervix    hpv many yrs ago   Allergy 1956   Cataract 2019   Chronic kidney disease    Elevated cholesterol    Hypertension    Past Surgical History:  Procedure Laterality Date   DILATION AND CURETTAGE OF UTERUS  2003   ESOPHAGOGASTRODUODENOSCOPY (EGD) WITH PROPOFOL  N/A 05/04/2020   Procedure: ESOPHAGOGASTRODUODENOSCOPY (EGD) WITH PROPOFOL ;  Surgeon: Shila Gustav GAILS, MD;  Location: [facility];  Service: Endoscopy;  Laterality: N/A;   GYNECOLOGIC CRYOSURGERY  1981   for abnormal pap smear many yrs ago   HERNIA REPAIR  2022   HIATAL HERNIA REPAIR N/A 05/06/2020   Procedure: LAPAROSCOPIC REPAIR OF HIATAL HERNIA;  Surgeon: Gladis Cough, MD;  Location: [facility];  Service: General;  Laterality: N/A;   LAPAROSCOPIC NISSEN FUNDOPLICATION N/A 05/06/2020   Procedure: POSSIBLE LAPAROSCOPIC NISSEN FUNDOPLICATION;  Surgeon: Gladis Cough, MD;  Location: [facility];  Service: General;  Laterality: N/A;   Family History  Problem Relation Age of Onset   Bowel  Disease Mother        perforated bowel- age 45   Hypertension Mother    Diabetes Father    Hypertension Father    Congestive Heart Failure Father    Heart attack Father    Heart disease Father    Hypertension Brother    Pneumonia Paternal Grandmother        died during the 11/30/1916 flu pandemic   Social History   Socioeconomic History    Marital status: Single    Spouse name: Not on file   Number of children: Not on file   Years of education: Not on file   Highest education level: Master's degree (e.g., MA, MS, MEng, MEd, MSW, MBA)  Occupational History   Not on file  Tobacco Use   Smoking status: Never   Smokeless tobacco: Never  Vaping Use   Vaping status: Never Used  Substance and Sexual Activity   Alcohol use: Never   Drug use: Never   Sexual activity: Not Currently    Partners: Male    Birth control/protection: Post-menopausal, None  Other Topics Concern   Not on file  Social History Narrative   Not married- divorced since 12/01/1998   Two grown daughters- one in [place] one in [place]   1 grandson in college and one who was born in 12/01/2018   Retired Runner, 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, 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[place], [place]   Enjoys Agricultural consultant work at [employer]   Social research officer, government, walking   Has a dog   Social Drivers of Health   Financial Resource Strain: Patient Declined (11/13/2023)   Overall Financial Resource Strain (CARDIA)    Difficulty of Paying Living Expenses: Patient declined  Food Insecurity: Patient Declined (11/13/2023)   Hunger Vital Sign    Worried About Running Out of Food in the Last Year: Patient declined    Ran Out of Food in the Last Year: Patient declined  Transportation Needs: No Transportation Needs (11/13/2023)   PRAPARE - Administrator, Civil Service (Medical): No    Lack of Transportation (Non-Medical): No  Physical Activity: Sufficiently Active (11/13/2023)   Exercise Vital Sign    Days of Exercise per Week: 5 days    Minutes of Exercise per Session: 30 min  Stress: No Stress Concern Present (11/13/2023)   [employer] of Occupational Health - Occupational Stress Questionnaire    Feeling of Stress: Not at all  Social Connections: Moderately Integrated (11/13/2023)   Social Connection and Isolation Panel    Frequency of Communication with Friends and Family: More than three  times a week    Frequency of Social Gatherings with Friends and Family: Three times a week    Attends Religious Services: More than 4 times per year    Active Member of Clubs or Organizations: Yes    Attends Banker Meetings: More than 4 times per year    Marital Status: Divorced    Tobacco Counseling Counseling given: Not Answered    Clinical Intake:  Pre-visit preparation completed: Yes  Pain : No/denies pain  Diabetes: No  Lab Results  Component Value Date   HGBA1C 5.7 11/15/2020     How often do you need to have someone help you when you read instructions, pamphlets, or other written materials from your doctor or pharmacy?: 1 - Never  Interpreter Needed?: No  Information entered by :: Charmaine Bloodgood LPN   Activities of Daily Living     11/06/2023  8:55 AM  In your present state of health, do you have any difficulty performing the following activities:  Hearing? 0  Vision? 0  Difficulty concentrating or making decisions? 0  Walking or climbing stairs? 0  Dressing or bathing? 0  Doing errands, shopping? 0  Preparing Food and eating ? N  Using the Toilet? N  In the past six months, have you accidently leaked urine? N  Do you have problems with loss of bowel control? N  Managing your Medications? N  Managing your Finances? N  Housekeeping or managing your Housekeeping? N    Patient Care Team: Daryl Setter, NP as PCP - General (Internal Medicine) Nandigam, Kavitha V, MD as Consulting Physician (Gastroenterology) Francella Fairy SAILOR, OD (Optometry)  I have updated your Care Teams any recent Medical Services you may have received from other providers in the past year.     Assessment:   This is a routine wellness examination for Leeanna.  Hearing/Vision screen Hearing Screening - Comments:: Denies hearing difficulties   Vision Screening - Comments:: up to date with routine eye exams with Dr. Francella    Goals Addressed              This Visit's Progress    Remain active and independent   On track      Depression Screen     11/13/2023   10:50 AM 10/23/2023    8:57 AM 04/23/2023   10:08 AM 03/01/2023   10:16 AM 12/06/2022    8:10 AM 05/23/2022    9:04 PM 02/22/2022   10:30 AM  PHQ 2/9 Scores  PHQ - 2 Score 0 0 0 0 0 0 0  PHQ- 9 Score 0 0 0    3    Fall Risk     11/06/2023    8:55 AM 10/23/2023    8:57 AM 04/23/2023   10:08 AM 03/01/2023   10:16 AM 01/17/2023   10:49 AM  Fall Risk   Falls in the past year? 0 0 0 0 0  Number falls in past yr: 0 0 0 0 0  Injury with Fall? 0 0 0 0 0  Risk for fall due to : No Fall Risks No Fall Risks No Fall Risks No Fall Risks   Follow up Falls prevention discussed;Education provided;Falls evaluation completed Falls evaluation completed Falls evaluation completed Falls evaluation completed Falls evaluation completed    MEDICARE RISK AT HOME:  Medicare Risk at Home Any stairs in or around the home?: (Patient-Rptd) Yes If so, are there any without handrails?: (Patient-Rptd) Yes Home free of loose throw rugs in walkways, pet beds, electrical cords, etc?: (Patient-Rptd) Yes Adequate lighting in your home to reduce risk of falls?: (Patient-Rptd) Yes Life alert?: (Patient-Rptd) No Use of a cane, walker or w/c?: (Patient-Rptd) No Grab bars in the bathroom?: (Patient-Rptd) Yes Shower chair or bench in shower?: (Patient-Rptd) No Elevated toilet seat or a handicapped toilet?: (Patient-Rptd) No  TIMED UP AND GO:  Was the test performed?  No  Cognitive Function: 6CIT completed        11/13/2023   10:52 AM 05/23/2022    9:05 PM  6CIT Screen  What Year? 0 points 0 points  What month? 0 points 0 points  What time? 0 points 0 points  Count back from 20 0 points 0 points  Months in reverse 0 points 0 points  Repeat phrase 0 points 0 points  Total Score 0 points 0 points    Immunizations Immunization History  Administered Date(s) Administered   Fluad Quad(high Dose 65+)  11/15/2020, 11/04/2021   Fluzone Influenza virus vaccine,trivalent (IIV3), split virus 12/29/2013   Hepatitis A 06/29/2010   Hepatitis A, Ped/Adol-2 Dose 06/29/2010   Hepatitis B, PED/ADOLESCENT 05/30/2010, 06/29/2010   INFLUENZA, HIGH DOSE SEASONAL PF 11/27/2016, 12/12/2016, 11/07/2017   Influenza,inj,quad, With Preservative 12/29/2013   Influenza-Unspecified 11/07/2017   PFIZER(Purple Top)SARS-COV-2 Vaccination 04/15/2019, 05/13/2019   Pneumococcal Conjugate-13 03/02/2017   Pneumococcal Polysaccharide-23 04/29/2018   Td 02/27/1998   Td (Adult), 2 Lf Tetanus Toxid, Preservative Free 02/27/1998   Tdap 02/28/2007, 02/22/2010, 10/22/2019   Zoster Recombinant(Shingrix) 02/26/2020   Zoster, Unspecified 02/26/2020    Screening Tests Health Maintenance  Topic Date Due   Zoster Vaccines- Shingrix (2 of 2) 04/22/2020   Mammogram  03/11/2023   Influenza Vaccine  09/28/2023   COVID-19 Vaccine (3 - 2025-26 season) 10/29/2023   DEXA SCAN  02/29/2024 (Originally 08/15/2016)   Fecal DNA (Cologuard)  11/23/2023   Medicare Annual Wellness (AWV)  11/12/2024   DTaP/Tdap/Td (6 - Td or Tdap) 10/21/2029   Pneumococcal Vaccine: 50+ Years  Completed   Hepatitis C Screening  Completed   HPV VACCINES  Aged Out   Meningococcal B Vaccine  Aged Out   Hepatitis B Vaccines 19-59 Average Risk  Discontinued    Health Maintenance Items Addressed: Patient declines mammogram and dexa at this time; information provided on vaccine recommendations   Additional Screening:  Vision Screening: Recommended annual ophthalmology exams for early detection of glaucoma and other disorders of the eye. Is the patient up to date with their annual eye exam?  Yes  Who is the provider or what is the name of the office in which the patient attends annual eye exams? Dr. Francella   Dental Screening: Recommended annual dental exams for proper oral hygiene  Community Resource Referral / Chronic Care Management: CRR required this  visit?  No   CCM required this visit?  No   Plan:    I have personally reviewed and noted the following in the patient's chart:   Medical and social history Use of alcohol, tobacco or illicit drugs  Current medications and supplements including opioid prescriptions. Patient is not currently taking opioid prescriptions. Functional ability and status Nutritional status Physical activity Advanced directives List of other physicians Hospitalizations, surgeries, and ER visits in previous 12 months Vitals Screenings to include cognitive, depression, and falls Referrals and appointments  In addition, I have reviewed and discussed with patient certain preventive protocols, quality metrics, and best practice recommendations. A written personalized care plan for preventive services as well as general preventive health recommendations were provided to patient.   Lavelle Pfeiffer [place], [place]   0/[id number]   After Visit Summary: (MyChart) Due to this being a telephonic visit, the after visit summary with patients personalized plan was offered to patient via MyChart   Notes: Nothing significant to report at this time.

## 2023-11-15 IMAGING — MG MM DIGITAL SCREENING BILAT W/ TOMO AND CAD
8 series · 8 of 24 positions shown · non-contrast
Comparison: Previous exam(s).

CLINICAL DATA: Screening.

EXAM:
DIGITAL SCREENING BILATERAL MAMMOGRAM WITH TOMOSYNTHESIS AND CAD
TECHNIQUE: Bilateral screening digital craniocaudal and mediolateral oblique
mammograms were obtained. Bilateral screening digital breast
tomosynthesis was performed. The images were evaluated with
computer-aided detection.

[R CC synth-2D]
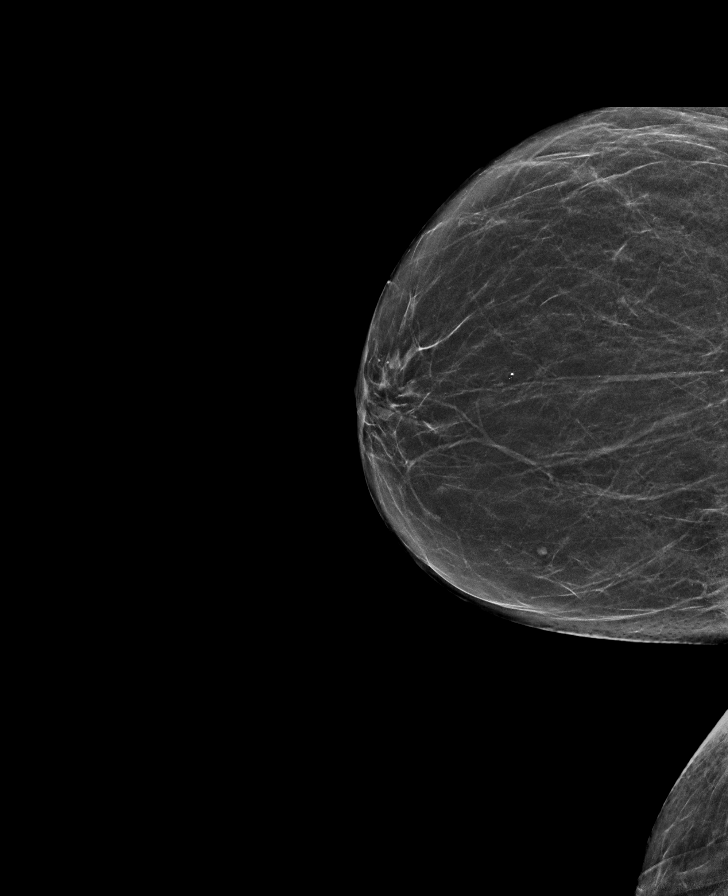

[R MLO synth-2D]
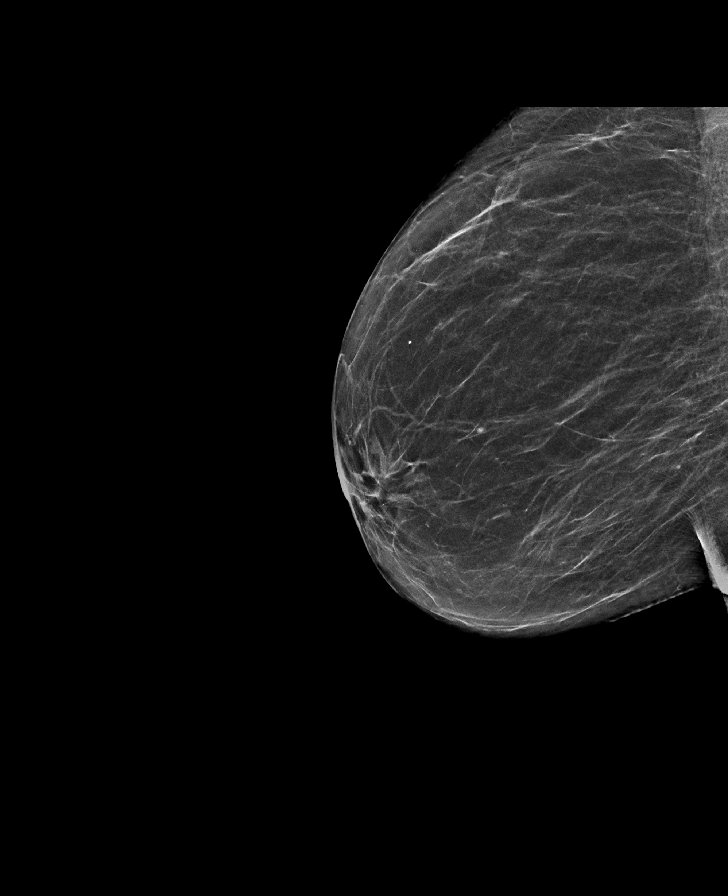

[L CC synth-2D]
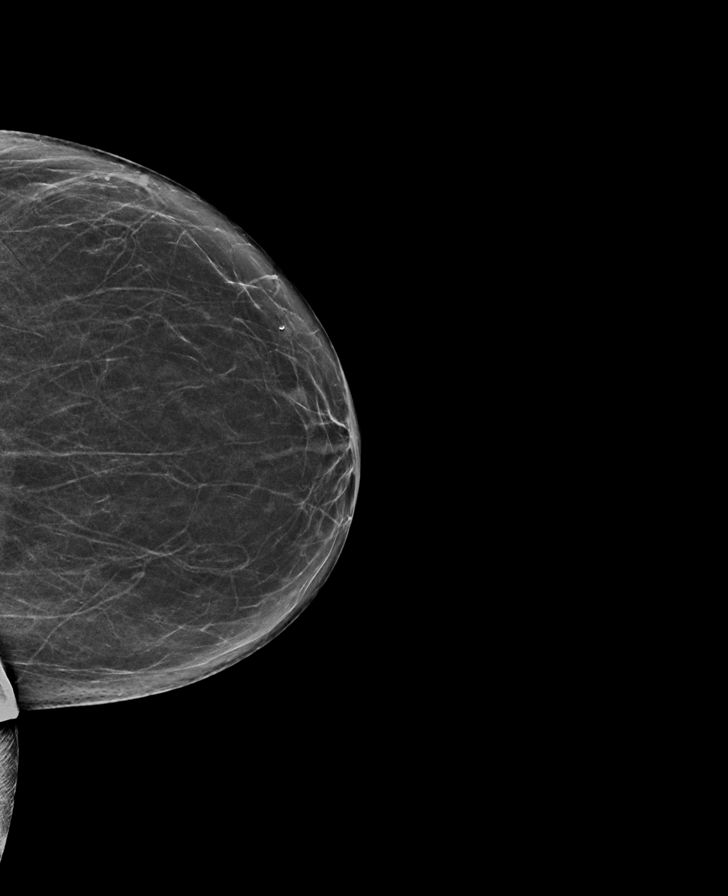

[L MLO synth-2D]
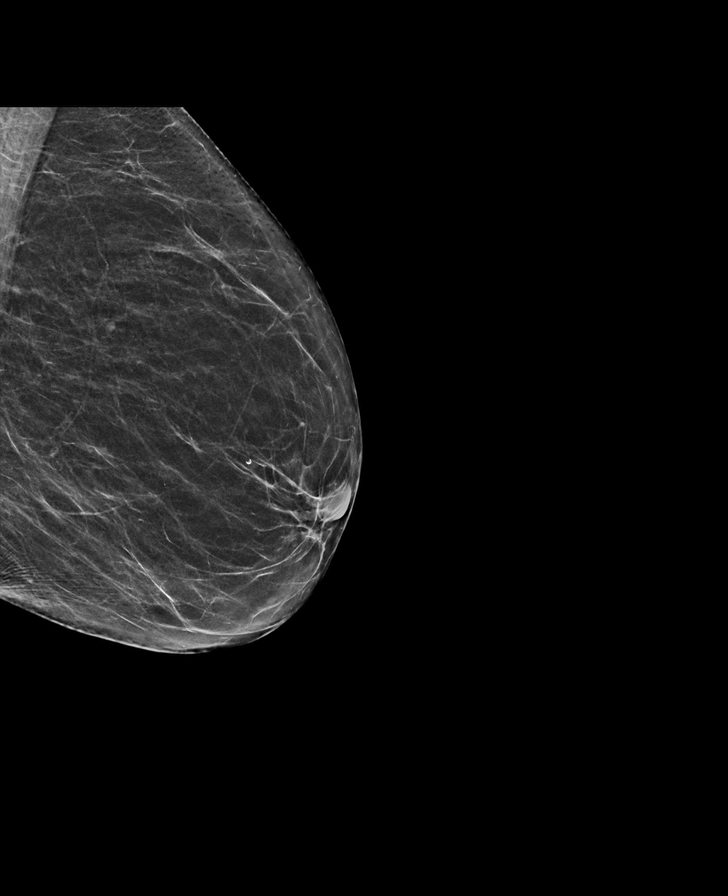

[R CC tomo · tomo slice 26/51.0]
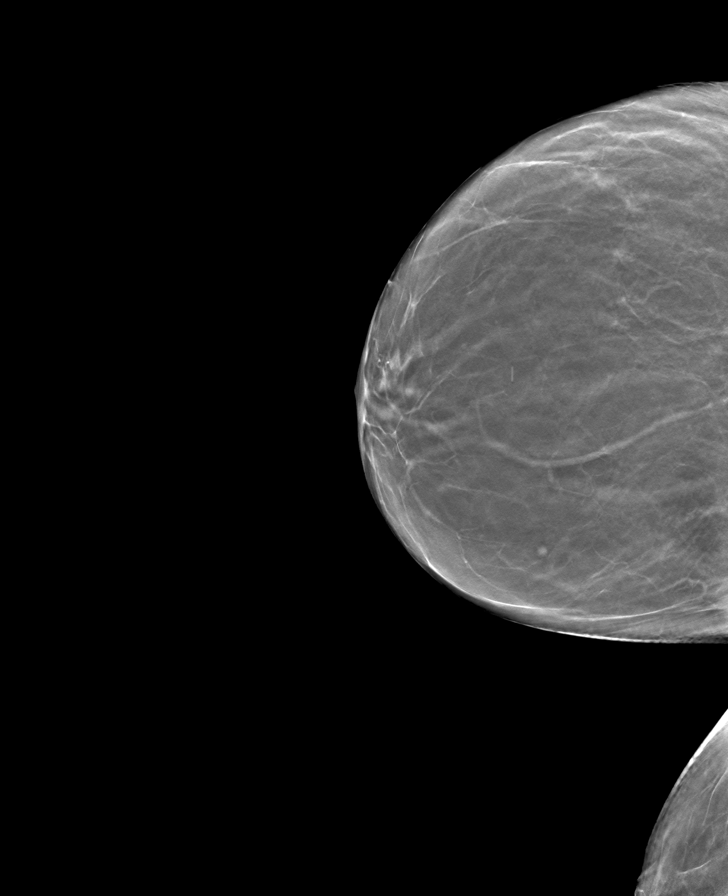

[L CC tomo · tomo slice 27/52.0]
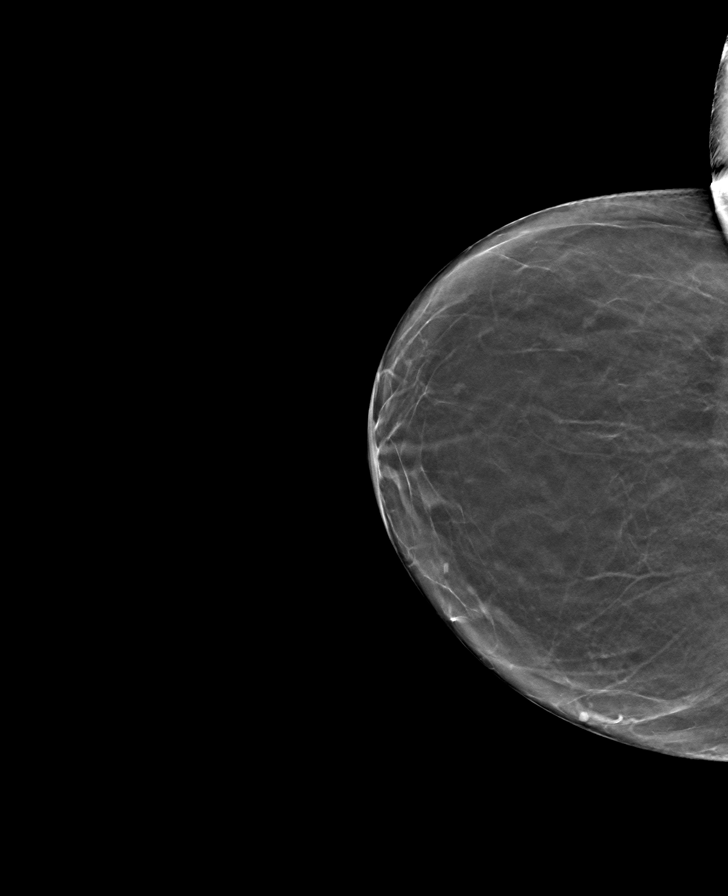

[R MLO tomo · tomo slice 29/56.0]
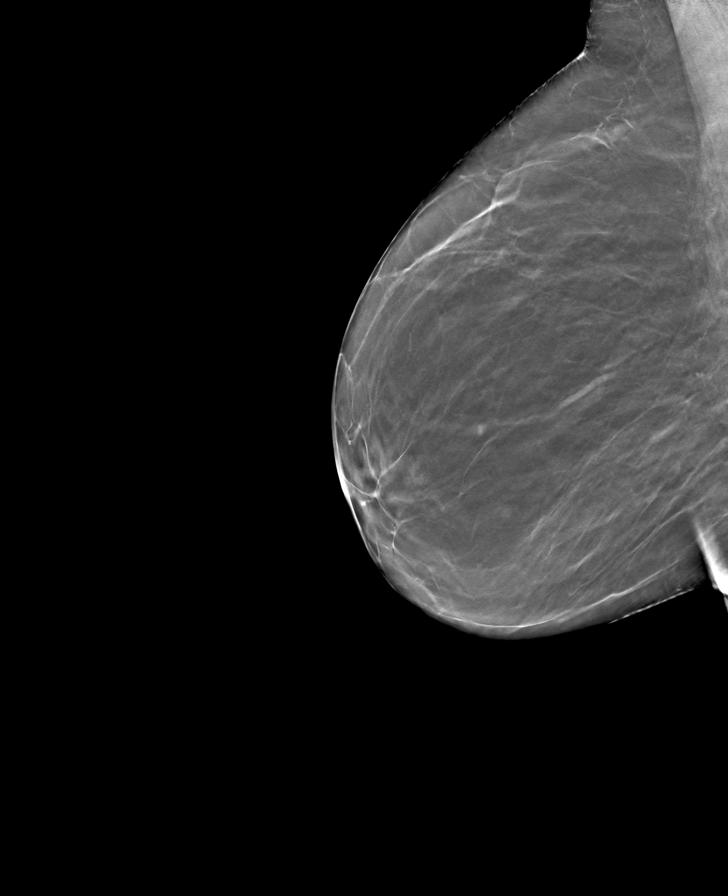

[L MLO tomo · tomo slice 28/55.0]
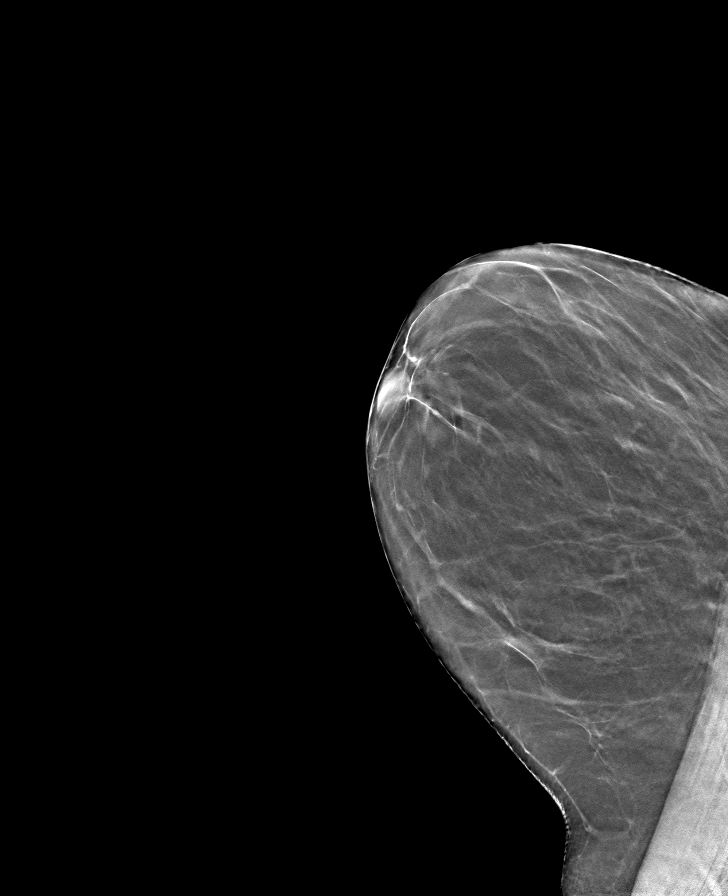

[8 of 24 positions shown; findings below may reference images not displayed]

ACR Breast Density Category b: There are scattered areas of
fibroglandular density.
FINDINGS: There are no findings suspicious for malignancy.
IMPRESSION: No mammographic evidence of malignancy. A result letter of this
screening mammogram will be mailed directly to the patient.

RECOMMENDATION:
Screening mammogram in one year. (Code:51-O-LD2)

BI-RADS CATEGORY  1: Negative.

## 2023-11-19 ENCOUNTER — Other Ambulatory Visit: Payer: Self-pay

## 2023-11-19 ENCOUNTER — Encounter (HOSPITAL_BASED_OUTPATIENT_CLINIC_OR_DEPARTMENT_OTHER): Payer: Self-pay

## 2023-11-19 DIAGNOSIS — I700 Atherosclerosis of aorta: Secondary | ICD-10-CM | POA: Diagnosis not present

## 2023-11-19 DIAGNOSIS — Z79899 Other long term (current) drug therapy: Secondary | ICD-10-CM | POA: Insufficient documentation

## 2023-11-19 DIAGNOSIS — I10 Essential (primary) hypertension: Secondary | ICD-10-CM | POA: Insufficient documentation

## 2023-11-19 LAB — CBC WITH DIFFERENTIAL/PLATELET
Abs Immature Granulocytes: 0.02 K/uL (ref 0.00–0.07)
Basophils Absolute: 0.1 K/uL (ref 0.0–0.1)
Basophils Relative: 1 %
Eosinophils Absolute: 0.3 K/uL (ref 0.0–0.5)
Eosinophils Relative: 4 %
HCT: 39.6 % (ref 36.0–46.0)
Hemoglobin: 13.5 g/dL (ref 12.0–15.0)
Immature Granulocytes: 0 %
Lymphocytes Relative: 32 %
Lymphs Abs: 1.9 K/uL (ref 0.7–4.0)
MCH: 29.5 pg (ref 26.0–34.0)
MCHC: 34.1 g/dL (ref 30.0–36.0)
MCV: 86.5 fL (ref 80.0–100.0)
Monocytes Absolute: 0.6 K/uL (ref 0.1–1.0)
Monocytes Relative: 9 %
Neutro Abs: 3.1 K/uL (ref 1.7–7.7)
Neutrophils Relative %: 54 %
Platelets: 228 K/uL (ref 150–400)
RBC: 4.58 MIL/uL (ref 3.87–5.11)
RDW: 12.8 % (ref 11.5–15.5)
WBC: 6 K/uL (ref 4.0–10.5)
nRBC: 0 % (ref 0.0–0.2)

## 2023-11-19 NOTE — ED Triage Notes (Signed)
 Pt states her BP has been running high Takes Telmisartan  40mg  daily and takes has directed. No symptoms Denies HA, N/V or dizziness BP normally in the 120s

## 2023-11-20 ENCOUNTER — Emergency Department (HOSPITAL_BASED_OUTPATIENT_CLINIC_OR_DEPARTMENT_OTHER)
Admission: EM | Admit: 2023-11-20 | Discharge: 2023-11-20 | Disposition: A | Discharge status: Home / Self Care | Attending: Emergency Medicine | Admitting: Emergency Medicine

## 2023-11-20 ENCOUNTER — Emergency Department (HOSPITAL_BASED_OUTPATIENT_CLINIC_OR_DEPARTMENT_OTHER)

## 2023-11-20 DIAGNOSIS — I700 Atherosclerosis of aorta: Secondary | ICD-10-CM | POA: Diagnosis not present

## 2023-11-20 DIAGNOSIS — I10 Essential (primary) hypertension: Secondary | ICD-10-CM

## 2023-11-20 LAB — COMPREHENSIVE METABOLIC PANEL WITH GFR
ALT: 10 U/L (ref 0–44)
AST: 22 U/L (ref 15–41)
Albumin: 4.5 g/dL (ref 3.5–5.0)
Alkaline Phosphatase: 79 U/L (ref 38–126)
Anion gap: 10 (ref 5–15)
BUN: 14 mg/dL (ref 8–23)
CO2: 26 mmol/L (ref 22–32)
Calcium: 9.7 mg/dL (ref 8.9–10.3)
Chloride: 106 mmol/L (ref 98–111)
Creatinine, Ser: 0.93 mg/dL (ref 0.44–1.00)
GFR, Estimated: >60 mL/min (ref >60)
Glucose, Bld: 115 mg/dL — ABNORMAL HIGH (ref 70–99)
Potassium: 4 mmol/L (ref 3.5–5.1)
Sodium: 142 mmol/L (ref 135–145)
Total Bilirubin: 0.8 mg/dL (ref 0.0–1.2)
Total Protein: 7.3 g/dL (ref 6.5–8.1)

## 2023-11-20 LAB — TROPONIN T, HIGH SENSITIVITY: Troponin T High Sensitivity: <15 ng/L (ref 0–19)

## 2023-11-20 NOTE — ED Provider Notes (Signed)
 [facility] Provider Note   CSN: [id number] Arrival date & time: 11/19/23  2315     Patient presents with: Hypertension   Monica Lara is a 73 y.o. female.    Hypertension     73 year old female with medical history significant for HTN, HLD presenting to the emergency department with concern for elevated blood pressure.  The patient states that over the weekend she had slightly loose stools.  She had an episode of room spinning dizziness over the weekend that has since resolved.  She also woke up with a very mild headache.  She noticed her blood pressure was running high.  She has had no facial droop, difficulty swallowing or speaking, focal numbness or weakness in any extremity.  She takes telmisartan  40 mg daily as directed.  She denies any nausea or vomiting.  Her headache that she woke up with was not a severe headache it was mild and is not described as thunderclap.  She denies any fevers or neck stiffness.  Her blood pressure normally runs in the 120s.  She denies any vision changes, vision loss, confusion, severe chest pain, shortness of breath or abdominal pain.  Prior to Admission medications   Medication Sig Start Date End Date Taking? Authorizing Provider  inclisiran (LEQVIO ) 284 MG/1.5ML SOSY injection  08/18/22   [provider]  telmisartan  (MICARDIS ) 40 MG tablet TAKE 1 TABLET BY MOUTH EVERY DAY 07/24/23   O'Sullivan, Melissa, NP    Allergies: 3-methyl-2-benzothiazolinone hydrazone, Ezetimibe, Sulfa antibiotics, and Statins    Review of Systems  All other systems reviewed and are negative.   Updated Vital Signs BP (!) 147/82   Pulse 64   Temp 97.7 F (36.5 C) (Oral)   Resp 17   Ht 5' (1.524 m)   Wt 55.3 kg   SpO2 99%   BMI 23.83 kg/m   Physical Exam Vitals and nursing note reviewed.  Constitutional:      General: She is not in acute distress.    Appearance: She is well-developed.  HENT:     Head:  Normocephalic and atraumatic.  Eyes:     Conjunctiva/sclera: Conjunctivae normal.  Neck:     Comments: Range of motion of the neck intact no meningismus Cardiovascular:     Rate and Rhythm: Normal rate and regular rhythm.     Heart sounds: No murmur heard. Pulmonary:     Effort: Pulmonary effort is normal. No respiratory distress.     Breath sounds: Normal breath sounds.  Abdominal:     Palpations: Abdomen is soft.     Tenderness: There is no abdominal tenderness.  Musculoskeletal:        General: No swelling.     Cervical back: Normal range of motion and neck supple.  Skin:    General: Skin is warm and dry.     Capillary Refill: Capillary refill takes less than 2 seconds.  Neurological:     Mental Status: She is alert.     Comments: MENTAL STATUS EXAM:    Orientation: Alert and oriented to person, place and time.  Memory: Cooperative, follows commands well.  Language: Speech is clear and language is normal.   CRANIAL NERVES:    CN 2 (Optic): Visual fields intact to confrontation.  CN 3,4,6 (EOM): Pupils equal and reactive to light. Full extraocular eye movement without nystagmus.  CN 5 (Trigeminal): Facial sensation is normal, no weakness of masticatory muscles.  CN 7 (Facial): No facial weakness or  asymmetry.  CN 8 (Auditory): Auditory acuity grossly normal.  CN 9,10 (Glossophar): The uvula is midline, the palate elevates symmetrically.  CN 11 (spinal access): Normal sternocleidomastoid and trapezius strength.  CN 12 (Hypoglossal): The tongue is midline. No atrophy or fasciculations.SABRA   MOTOR:  Muscle Strength: 5/5RUE, 5/5LUE, 5/5RLE, 5/5LLE.   COORDINATION:   Intact finger-to-nose, no tremor.   SENSATION:   Intact to light touch all four extremities.  GAIT: Gait normal without ataxia   Psychiatric:        Mood and Affect: Mood normal.     (all labs ordered are listed, but only abnormal results are displayed) Labs Reviewed  COMPREHENSIVE METABOLIC PANEL WITH GFR  - Abnormal; Notable for the following components:      Result Value   Glucose, Bld 115 (*)    All other components within normal limits  CBC WITH DIFFERENTIAL/PLATELET  TROPONIN T, HIGH SENSITIVITY    EKG: EKG Interpretation Date/Time:  Tuesday November 20 2023 01:19:59 EDT Ventricular Rate:  69 PR Interval:  209 QRS Duration:  93 QT Interval:  431 QTC Calculation: 462 R Axis:   -2  Text Interpretation: Sinus rhythm Consider left atrial enlargement Low voltage, precordial leads Confirmed by Jerrol Agent ([id number]) on 11/20/2023 1:44:51 AM  Radiology: [facility] Chest Portable 1 View Result Date: 11/20/2023 CLINICAL DATA:  Hypertension EXAM: PORTABLE CHEST 1 VIEW COMPARISON:  05/03/2020 FINDINGS: Heart and mediastinal contours are within normal limits. No focal opacities or effusions. No acute bony abnormality. Aortic atherosclerosis. IMPRESSION: No active cardiopulmonary disease. Electronically Signed   By: Franky Crease M.D.   On: 11/20/2023 01:16     Procedures   Medications Ordered in the ED - No data to display                                  Medical Decision Making Amount and/or Complexity of Data Reviewed Labs: ordered. Radiology: ordered.     73 year old female with medical history significant for HTN, HLD presenting to the emergency department with concern for elevated blood pressure.  The patient states that over the weekend she had slightly loose stools.  She had an episode of room spinning dizziness over the weekend that has since resolved.  She also woke up with a very mild headache.  She noticed her blood pressure was running high.  She has had no facial droop, difficulty swallowing or speaking, focal numbness or weakness in any extremity.  She takes telmisartan  40 mg daily as directed.  She denies any nausea or vomiting.  Her headache that she woke up with was not a severe headache it was mild and is not described as thunderclap.  She denies any fevers or neck stiffness.   Her blood pressure normally runs in the 120s.  She denies any vision changes, vision loss, confusion, severe chest pain, shortness of breath or abdominal pain.  On arrival, the patient was afebrile, not tachycardic or tachypneic, hypertensive BP 202/79, saturating 99% on room air.  On exam, the patient had an intact neurologic exam, no nystagmus, no focal neurologic deficits, no facial droop, no dysmetria, had no difficulty with range of motion of the neck, she is afebrile, lungs clear to auscultation bilaterally, no lower extremity swelling.  Workup for hypertension initiated.  Patient currently without headache and has not been for 2 days.  Very low suspicion for subarachnoid hemorrhage, low concern for meningitis or encephalitis.  EKG revealed  sinus rhythm, ventricular rate 69, no acute ischemic changes.  Chest x-ray revealed no acute active cardiopulmonary disease.  Laboratory evaluation revealed no evidence of endorgan damage: CBC without a leukocytosis or anemia, CMP with stable serum creatinine at 0.93, no electrolyte abnormality, cardiac troponin less than 15.  The patient was observed in the emergency department and on recheck the patient was found to have drop in her blood pressure resting in her room down to 147/82, appropriately downtrending.  Did not recommend any changes to her antihypertensive regimen as she is currently already on an antihypertensive, I advised that she call her PCP to have a blood pressure recheck and to continue to discuss her ongoing outpatient blood pressure management.  No evidence for hypertensive emergency, neurologic exam intact, very low concern for stroke or other acute emergency requiring inpatient hospitalization and further diagnostic evaluation.  Provided the patient with extensive return precautions in the event of strokelike symptoms, severe headache/thunderclap headache, fever, other symptoms of hypertensive emergency or ACS.  Patient stable for discharge  and close outpatient follow-up     Final diagnoses:  Hypertension, unspecified type    ED Discharge Orders     None          Jerrol Agent, MD 11/20/23 [phone]

## 2023-11-20 NOTE — ED Notes (Signed)
 Pt. Reports elevated bp

## 2023-11-20 NOTE — Discharge Instructions (Addendum)
 Your EKG, chest x-ray and laboratory evaluation in addition to your physical exam to include neurologic exam was overall reassuring.  Recommend you follow-up closely with your primary care provider for continued outpatient management of your blood pressure.  Return for any severe worsening symptoms, thunderclap headache, strokelike symptoms, severe chest pain, shortness of breath or abdominal pain in the setting of uncontrolled blood pressure.

## 2023-11-26 ENCOUNTER — Emergency Department (HOSPITAL_BASED_OUTPATIENT_CLINIC_OR_DEPARTMENT_OTHER)

## 2023-11-26 ENCOUNTER — Emergency Department (HOSPITAL_BASED_OUTPATIENT_CLINIC_OR_DEPARTMENT_OTHER): Admission: EM | Admit: 2023-11-26 | Discharge: 2023-11-27 | Disposition: A | Discharge status: Home / Self Care

## 2023-11-26 ENCOUNTER — Encounter (HOSPITAL_BASED_OUTPATIENT_CLINIC_OR_DEPARTMENT_OTHER): Payer: Self-pay

## 2023-11-26 DIAGNOSIS — I2510 Atherosclerotic heart disease of native coronary artery without angina pectoris: Secondary | ICD-10-CM | POA: Diagnosis not present

## 2023-11-26 DIAGNOSIS — I10 Essential (primary) hypertension: Secondary | ICD-10-CM | POA: Diagnosis not present

## 2023-11-26 DIAGNOSIS — G319 Degenerative disease of nervous system, unspecified: Secondary | ICD-10-CM | POA: Insufficient documentation

## 2023-11-26 DIAGNOSIS — R519 Headache, unspecified: Secondary | ICD-10-CM | POA: Diagnosis not present

## 2023-11-26 DIAGNOSIS — R000 Tachycardia, unspecified: Secondary | ICD-10-CM | POA: Diagnosis not present

## 2023-11-26 DIAGNOSIS — R6883 Chills (without fever): Secondary | ICD-10-CM | POA: Diagnosis not present

## 2023-11-26 DIAGNOSIS — Z79899 Other long term (current) drug therapy: Secondary | ICD-10-CM | POA: Insufficient documentation

## 2023-11-26 DIAGNOSIS — I701 Atherosclerosis of renal artery: Secondary | ICD-10-CM | POA: Diagnosis not present

## 2023-11-26 DIAGNOSIS — I700 Atherosclerosis of aorta: Secondary | ICD-10-CM | POA: Insufficient documentation

## 2023-11-26 DIAGNOSIS — I7090 Unspecified atherosclerosis: Secondary | ICD-10-CM

## 2023-11-26 LAB — CBC WITH DIFFERENTIAL/PLATELET
Abs Immature Granulocytes: 0.03 K/uL (ref 0.00–0.07)
Basophils Absolute: 0.1 K/uL (ref 0.0–0.1)
Basophils Relative: 1 %
Eosinophils Absolute: 0.1 K/uL (ref 0.0–0.5)
Eosinophils Relative: 2 %
HCT: 42 % (ref 36.0–46.0)
Hemoglobin: 14.4 g/dL (ref 12.0–15.0)
Immature Granulocytes: 0 %
Lymphocytes Relative: 21 %
Lymphs Abs: 1.4 K/uL (ref 0.7–4.0)
MCH: 29.6 pg (ref 26.0–34.0)
MCHC: 34.3 g/dL (ref 30.0–36.0)
MCV: 86.4 fL (ref 80.0–100.0)
Monocytes Absolute: 0.6 K/uL (ref 0.1–1.0)
Monocytes Relative: 9 %
Neutro Abs: 4.5 K/uL (ref 1.7–7.7)
Neutrophils Relative %: 67 %
Platelets: 256 K/uL (ref 150–400)
RBC: 4.86 MIL/uL (ref 3.87–5.11)
RDW: 12.6 % (ref 11.5–15.5)
WBC: 6.8 K/uL (ref 4.0–10.5)
nRBC: 0 % (ref 0.0–0.2)

## 2023-11-26 LAB — COMPREHENSIVE METABOLIC PANEL WITH GFR
ALT: 12 U/L (ref 0–44)
AST: 24 U/L (ref 15–41)
Albumin: 4.6 g/dL (ref 3.5–5.0)
Alkaline Phosphatase: 88 U/L (ref 38–126)
Anion gap: 13 (ref 5–15)
BUN: 15 mg/dL (ref 8–23)
CO2: 23 mmol/L (ref 22–32)
Calcium: 9.7 mg/dL (ref 8.9–10.3)
Chloride: 102 mmol/L (ref 98–111)
Creatinine, Ser: 0.99 mg/dL (ref 0.44–1.00)
GFR, Estimated: >60 mL/min (ref >60)
Glucose, Bld: 110 mg/dL — ABNORMAL HIGH (ref 70–99)
Potassium: 3.7 mmol/L (ref 3.5–5.1)
Sodium: 138 mmol/L (ref 135–145)
Total Bilirubin: 1 mg/dL (ref 0.0–1.2)
Total Protein: 8 g/dL (ref 6.5–8.1)

## 2023-11-26 LAB — RESP PANEL BY RT-PCR (RSV, FLU A&B, COVID)  RVPGX2
Influenza A by PCR: NEGATIVE
Influenza B by PCR: NEGATIVE
Resp Syncytial Virus by PCR: NEGATIVE
SARS Coronavirus 2 by RT PCR: NEGATIVE

## 2023-11-26 MED ORDER — SODIUM CHLORIDE 0.9 % IV BOLUS
1000.0000 mL | Freq: Once | INTRAVENOUS | Status: AC
Start: 2023-11-26 — End: 2023-11-27
  Administered 2023-11-26: 1000 mL via INTRAVENOUS

## 2023-11-26 MED ORDER — PROCHLORPERAZINE EDISYLATE 10 MG/2ML IJ SOLN
5.0000 mg | Freq: Once | INTRAMUSCULAR | Status: AC
  Administered 2023-11-26: 5 mg via INTRAVENOUS
  Filled 2023-11-26: qty 2

## 2023-11-26 MED ORDER — SODIUM CHLORIDE 0.9 % IV SOLN
25.0000 mg | INTRAVENOUS | Status: DC | PRN
  Filled 2023-11-26: qty 0.5

## 2023-11-26 MED ORDER — IOHEXOL 350 MG/ML SOLN
100.0000 mL | Freq: Once | INTRAVENOUS | Status: AC | PRN
  Administered 2023-11-26: 100 mL via INTRAVENOUS

## 2023-11-26 NOTE — Discharge Instructions (Addendum)
 You do have what looks like chronic narrowing of one of your renal arteries.  I think follow-up with vascular surgery is warranted in this situation.   If you have any kind of fever or chills, altered mental status, confusion, then come back to the ED for further evaluation.  As we discussed, I do think   I did place a referral to cardiology outpatient.  Please follow-up with them.  You do have some coronary atherosclerosis.  This could be further explored with cardiology.  I do not think this is contributing to your symptoms.

## 2023-11-26 NOTE — ED Triage Notes (Addendum)
 Was seen last week here for HTN.   BP has been doing ok til today 12 noon began to experience headache (base of head down her neck, shoulders and down towards middle of back.   Denies N/V or dizziness  Alert and oriented no neuro deficits Pt states he HR is normally in the 70s  125 in triage  EKG obtained

## 2023-11-26 NOTE — ED Provider Notes (Signed)
 [facility] Provider Note   CSN: [id number] Arrival date & time: 11/26/23  1925     Patient presents with: Headache and Hypertension   Monica Lara is a 73 y.o. female.  {Add pertinent medical, surgical, social history, OB history to HPI:32947}  Headache Hypertension Associated symptoms include headaches.   Patient presents because a headache.  Patient states that she started with nonspecific backache today and subsequently started having headache as well.  Endorses chills but no fevers.  Patient states that the only time she ever felt like this in the past was whenever she had the flu.  No sick contacts that she is aware of.  No chest pain.  No shortness of breath.  No pleuritic chest pain or hemoptysis.  No nausea vomiting or diarrhea.  No vision changes.  No numbness or tingling anywhere.  Previous medical history reviewed : Patient was last seen in the ED on November 20, 2023 in setting of hypertension.  Negative workup at that time.     Prior to Admission medications   Medication Sig Start Date End Date Taking? Authorizing Provider  inclisiran (LEQVIO ) 284 MG/1.5ML SOSY injection  08/18/22   [provider]  telmisartan  (MICARDIS ) 40 MG tablet TAKE 1 TABLET BY MOUTH EVERY DAY 07/24/23   O'Sullivan, Melissa, NP    Allergies: 3-methyl-2-benzothiazolinone hydrazone, Ezetimibe, Sulfa antibiotics, and Statins    Review of Systems  Neurological:  Positive for headaches.    Updated Vital Signs BP (!) 152/91 (BP Location: Left Arm)   Pulse (!) 112   Temp 99.7 F (37.6 C) (Oral)   Resp 16   Ht 5' (1.524 m)   Wt 55 kg   SpO2 99%   BMI 23.68 kg/m   Physical Exam Vitals and nursing note reviewed.  Constitutional:      General: She is not in acute distress.    Appearance: She is well-developed.  HENT:     Head: Normocephalic and atraumatic.  Eyes:     Conjunctiva/sclera: Conjunctivae normal.  Cardiovascular:      Rate and Rhythm: Normal rate and regular rhythm.     Heart sounds: No murmur heard. Pulmonary:     Effort: Pulmonary effort is normal. No respiratory distress.     Breath sounds: Normal breath sounds.  Abdominal:     Palpations: Abdomen is soft.     Tenderness: There is no abdominal tenderness.  Musculoskeletal:        General: No swelling.     Cervical back: Neck supple.  Skin:    General: Skin is warm and dry.     Capillary Refill: Capillary refill takes less than 2 seconds.  Neurological:     Mental Status: She is alert.  Psychiatric:        Mood and Affect: Mood normal.     (all labs ordered are listed, but only abnormal results are displayed) Labs Reviewed  COMPREHENSIVE METABOLIC PANEL WITH GFR - Abnormal; Notable for the following components:      Result Value   Glucose, Bld 110 (*)    All other components within normal limits  CBC WITH DIFFERENTIAL/PLATELET    EKG: None  Radiology: CT Head Wo Contrast Result Date: 11/26/2023 EXAM: CT HEAD WITHOUT CONTRAST 11/26/2023 09:32:51 PM TECHNIQUE: CT of the head was performed without the administration of intravenous contrast. Automated exposure control, iterative reconstruction, and/or weight based adjustment of the mA/kV was utilized to reduce the radiation dose to as low as  reasonably achievable. COMPARISON: Brain MRI 10/20/2017. CLINICAL HISTORY: Headache, sudden, severe. 73 y/o female. Was seen last week here for HTN. Today at 12 noon she began to experience headache (base of head down her neck, shoulders and down towards middle of back. FINDINGS: BRAIN AND VENTRICLES: Age-related atrophy. No acute hemorrhage. No evidence of acute infarct. No hydrocephalus. No extra-axial collection. No mass effect or midline shift. ORBITS: No acute abnormality. SINUSES: No acute abnormality. SOFT TISSUES AND SKULL: No acute soft tissue abnormality. No skull fracture. IMPRESSION: 1. No acute intracranial abnormality. 2. Age-related cerebral  atrophy. Electronically signed by: Andrea Gasman MD 11/26/2023 09:41 PM EDT RP Workstation: [id number]    {Document cardiac monitor, telemetry assessment procedure when appropriate:32947} Procedures   Medications Ordered in the ED - No data to display    {Click here for ABCD2, HEART and other calculators REFRESH Note before signing:1}                              Medical Decision Making Amount and/or Complexity of Data Reviewed Labs: ordered.     Patient presents because a headache.  Patient states that she started with nonspecific backache today and subsequently started having headache as well.  Endorses chills but no fevers.  Patient states that the only time she ever felt like this in the past was whenever she had the flu.  No sick contacts that she is aware of.  No chest pain.  No shortness of breath.  No pleuritic chest pain or hemoptysis.  No nausea vomiting or diarrhea.  No vision changes.  No numbness or tingling anywhere.  Previous medical history reviewed : Patient was last seen in the ED on November 20, 2023 in setting of hypertension.  Negative workup at that time.       {Document critical care time when appropriate  Document review of labs and clinical decision tools ie CHADS2VASC2, etc  Document your independent review of radiology images and any outside records  Document your discussion with family members, caretakers and with consultants  Document social determinants of health affecting pt's care  Document your decision making why or why not admission, treatments were needed:32947:::1}   Final diagnoses:  None    ED Discharge Orders     None

## 2023-11-27 MED ORDER — KETOROLAC TROMETHAMINE 15 MG/ML IJ SOLN
15.0000 mg | Freq: Once | INTRAMUSCULAR | Status: AC
  Administered 2023-11-27: 15 mg via INTRAVENOUS
  Filled 2023-11-27: qty 1

## 2023-11-27 NOTE — ED Provider Notes (Signed)
 Care assumed at shift change. Here with headache, elevated BP and backache. Awaiting CTA for dissection and treatment of headache,.  Physical Exam  BP 138/68   Pulse 100   Temp 99.7 F (37.6 C) (Oral)   Resp 16   Ht 5' (1.524 m)   Wt 55 kg   SpO2 97%   BMI 23.68 kg/m   Physical Exam  Procedures  Procedures  ED Course / MDM   Clinical Course as of 11/27/23 0114  Tue Nov 27, 2023  0113 CTA is neg for acute findings. Headache improved after medications and IVF. Recommend she continue with usual BP management, APAP if needed and close PCP follow up, RTED for any other concerns.   [CS]    Clinical Course User Index [CS] Roselyn Carlin NOVAK, MD   Medical Decision Making Amount and/or Complexity of Data Reviewed Labs: ordered. Radiology: ordered.  Risk Prescription drug management.          Roselyn Carlin NOVAK, MD 11/27/23 [phone]

## 2023-11-28 ENCOUNTER — Ambulatory Visit: Admitting: Family

## 2023-11-28 VITALS — BP 162/78 | HR 77 | Temp 98.5°F | Resp 16 | Ht 60.0 in | Wt 124.0 lb

## 2023-11-28 DIAGNOSIS — M549 Dorsalgia, unspecified: Secondary | ICD-10-CM | POA: Diagnosis not present

## 2023-11-28 DIAGNOSIS — Z23 Encounter for immunization: Secondary | ICD-10-CM

## 2023-11-28 DIAGNOSIS — I10 Essential (primary) hypertension: Secondary | ICD-10-CM

## 2023-11-28 DIAGNOSIS — I701 Atherosclerosis of renal artery: Secondary | ICD-10-CM | POA: Insufficient documentation

## 2023-11-28 DIAGNOSIS — Z1231 Encounter for screening mammogram for malignant neoplasm of breast: Secondary | ICD-10-CM

## 2023-11-28 DIAGNOSIS — I7121 Aneurysm of the ascending aorta, without rupture: Secondary | ICD-10-CM

## 2023-11-28 MED ORDER — VALSARTAN 160 MG PO TABS: 160.0000 mg | ORAL_TABLET | Freq: Every day | ORAL | 0 refills | Status: DC

## 2023-11-28 NOTE — Assessment & Plan Note (Signed)
?   Musculoskeletal. Monitor.

## 2023-11-28 NOTE — Assessment & Plan Note (Signed)
 Hx of atrophic left kidney. Could be contributing to her HTN.  Obtain US  for further evaluation.  She does continue to follow with nephrology.

## 2023-11-28 NOTE — Patient Instructions (Addendum)
 VISIT SUMMARY:  You came in today because of dizziness, nausea, and high blood pressure. We discussed your symptoms, including headaches and back pain, and reviewed your current medications and health conditions. We made some changes to your treatment plan to help manage your blood pressure and other health issues.  YOUR PLAN:  HYPERTENSION WITH LEFT RENAL ARTERY STENOSIS A\: Your high blood pressure may be worsened by a narrowing of the artery to your left kidney. You also have chronic kidney disease. -We will switch your medication from Micardis  to valsartan 160 mg. -We will order an ultrasound of your renal artery. -Please come back in one week for a blood pressure check and kidney function test. -If your blood pressure remains high, we may need to add valsartan HCT.  HEADACHE AND BACK PAIN: You have been experiencing headaches and back pain, which may be related to your high blood pressure. -Continue taking Tylenol  as needed. -Monitor your symptoms and let us  know if they do not improve in a few weeks.  AORTIC DILATION: Your aorta is slightly enlarged, but it is stable compared to previous measurements. -We will schedule follow-up imaging in one year to monitor this.  CORONARY ARTERY CALCIFICATION: You have calcification in your coronary arteries, which is being managed with Leqvio  due to your intolerance to statins. -Continue taking Leqvio  as prescribed.  HYPERLIPIDEMIA: You have high cholesterol, which is being managed with Leqvio  due to your intolerance to statins. -Continue taking Leqvio  as prescribed.  GENERAL HEALTH MAINTENANCE: We discussed routine health maintenance including screenings and vaccinations. -We will order a mammogram for you. -You received a flu shot today. -We discussed your reaction to the shingles vaccine and decided against a second dose.

## 2023-11-28 NOTE — Assessment & Plan Note (Addendum)
 Uncontrolled. Will d/c micardis  and switch to diovan 160mg . Repeat bp and bmet in 1 week at nurse visit. Hopefully improving her blood pressure will help with headache. OK to continue tylenol  prn pain.

## 2023-11-28 NOTE — Progress Notes (Signed)
 Thank you for the heads up.  I agree we should check the renal artery ultrasound and I hope that switching to valsartan will do the trick.

## 2023-11-28 NOTE — Telephone Encounter (Signed)
 Patient was seen today.

## 2023-11-28 NOTE — Assessment & Plan Note (Signed)
 Stable on CT, plan repeat imaging in 1 year.

## 2023-11-28 NOTE — Progress Notes (Signed)
 Subjective:     Patient ID: Monica Lara, female    DOB: 07-15-1950, 73 y.o.   MRN: [id number]  Chief Complaint  Patient presents with   Hypertension    Here for follow up   Headache    Patient reports headache, better with tylenol     HPI  Discussed the use of AI scribe software for clinical note transcription with the patient, who gave verbal consent to proceed.  History of Present Illness    The patient is a 73 year old female who presents today for emergency room follow-up. She is accompanied by her daughter. She was seen in the [facility] Emergency Department on November 17, 2023, with complaints of headache, elevated blood pressure, and back pain. Workup in the Emergency Department included a CT angiogram of the chest, which noted patchy calcific plaques in the distal aortic arch, with a maximal aortic caliber of 3.7 cm, which was stable. Note was also made of left venous and three vessel coronary artery calcification, greatest in the left anterior descending artery. There was noted severe high-grade soft plaque stenosis in the proximal 5 mm of the left renal artery, with asymmetric chronic cortical atrophy and volume loss of the left kidney.  The patient, with hypertension, presents with dizziness, nausea, and elevated blood pressure.  Dizziness and nausea began last Monday, with severe dizziness upon waking, requiring support for stability. Nausea and vomiting occurred, with only liquid expelled, improving by the afternoon. On the following Monday, severe pain in the neck, head, under the shoulder blades, and below the waist developed, worsening with movement. Chills and shaking were present that evening. An ER visit included normal blood work and CT scans, with negative flu and COVID tests.  They have hypertension and are taking Micardis , but blood pressure has been higher than normal. They previously took valsartan, which was more effective. Tylenol  provides some  relief for headache and back pain, but symptoms return when not taken.  A past cardiac CT showed coronary calcium score of 451 prompting cardiology to begin pt on Leqvio .   BP Readings from Last 3 Encounters:  11/28/23 (!) 162/78  11/27/23 138/68  11/20/23 (!) 147/82   Lab Results  Component Value Date   CHOL 201 (H) 10/23/2023   HDL 59.40 10/23/2023   LDLCALC 121 (H) 10/23/2023   TRIG 103.0 10/23/2023   CHOLHDL 3 10/23/2023       Health Maintenance Due  Topic Date Due   Mammogram  03/11/2023   COVID-19 Vaccine (3 - 2025-26 season) 10/29/2023   Fecal DNA (Cologuard)  11/23/2023    Past Medical History:  Diagnosis Date   Abnormal Pap smear of cervix    hpv many yrs ago   Allergy 1956   Cataract 2019   Chronic kidney disease    Elevated cholesterol    Hypertension     Past Surgical History:  Procedure Laterality Date   DILATION AND CURETTAGE OF UTERUS  2003   ESOPHAGOGASTRODUODENOSCOPY (EGD) WITH PROPOFOL  N/A 05/04/2020   Procedure: ESOPHAGOGASTRODUODENOSCOPY (EGD) WITH PROPOFOL ;  Surgeon: Shila Gustav GAILS, MD;  Location: [facility];  Service: Endoscopy;  Laterality: N/A;   GYNECOLOGIC CRYOSURGERY  1981   for abnormal pap smear many yrs ago   HERNIA REPAIR  2022   HIATAL HERNIA REPAIR N/A 05/06/2020   Procedure: LAPAROSCOPIC REPAIR OF HIATAL HERNIA;  Surgeon: Gladis Cough, MD;  Location: [facility] ORS;  Service: General;  Laterality: N/A;   LAPAROSCOPIC NISSEN FUNDOPLICATION N/A 05/06/2020   Procedure:  POSSIBLE LAPAROSCOPIC NISSEN FUNDOPLICATION;  Surgeon: Gladis Cough, MD;  Location: [facility] OR[facility];  Service: General;  Laterality: N/A;    Family History  Problem Relation Age of Onset   Bowel Disease Mother        perforated bowel- age 67   Hypertension Mother    Diabetes Father    Hypertension Father    Congestive Heart Failure Father    Heart attack Father    Heart disease Father    Hypertension Brother    Pneumonia Paternal Grandmother        died during  the 12-07-1916 flu pandemic    Social History   Socioeconomic History   Marital status: Single    Spouse name: Not on file   Number of children: Not on file   Years of education: Not on file   Highest education level: Master's degree (e.g., MA, MS, MEng, MEd, MSW, MBA)  Occupational History   Not on file  Tobacco Use   Smoking status: Never   Smokeless tobacco: Never  Vaping Use   Vaping status: Never Used  Substance and Sexual Activity   Alcohol use: Never   Drug use: Never   Sexual activity: Not Currently    Partners: Male    Birth control/protection: Post-menopausal, None  Other Topics Concern   Not on file  Social History Narrative   Not married- divorced since 08-Dec-1998   Two grown daughters- one in [place] one in [place]   1 grandson in college and one who was born in 12-08-18   Retired Runner, 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, 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[place], [place]   Enjoys Agricultural consultant work at [employer]   Social research officer, government, walking   Has a dog   Social Drivers of Health   Financial Resource Strain: Patient Declined (11/13/2023)   Overall Financial Resource Strain (CARDIA)    Difficulty of Paying Living Expenses: Patient declined  Food Insecurity: Patient Declined (11/13/2023)   Hunger Vital Sign    Worried About Running Out of Food in the Last Year: Patient declined    Ran Out of Food in the Last Year: Patient declined  Transportation Needs: No Transportation Needs (11/13/2023)   PRAPARE - Administrator, Civil Service (Medical): No    Lack of Transportation (Non-Medical): No  Physical Activity: Sufficiently Active (11/13/2023)   Exercise Vital Sign    Days of Exercise per Week: 5 days    Minutes of Exercise per Session: 30 min  Stress: No Stress Concern Present (11/13/2023)   [employer] of Occupational Health - Occupational Stress Questionnaire    Feeling of Stress: Not at all  Social Connections: Moderately Integrated (11/13/2023)   Social Connection and Isolation Panel     Frequency of Communication with Friends and Family: More than three times a week    Frequency of Social Gatherings with Friends and Family: Three times a week    Attends Religious Services: More than 4 times per year    Active Member of Clubs or Organizations: Yes    Attends Banker Meetings: More than 4 times per year    Marital Status: Divorced  Intimate Partner Violence: Not At Risk (11/13/2023)   Humiliation, Afraid, Rape, and Kick questionnaire    Fear of Current or Ex-Partner: No    Emotionally Abused: No    Physically Abused: No    Sexually Abused: No    Outpatient Medications Prior to Visit  Medication Sig Dispense Refill   inclisiran (  LEQVIO ) 284 MG/1.5ML SOSY injection      telmisartan  (MICARDIS ) 40 MG tablet TAKE 1 TABLET BY MOUTH EVERY DAY 90 tablet 1   No facility-administered medications prior to visit.    Allergies  Allergen Reactions   3-Methyl-2-Benzothiazolinone Hydrazone     Other reaction(s): Other   Ezetimibe Other (See Comments)    Other reaction(s): Myalgias (intolerance)   Sulfa Antibiotics Rash   Statins Other (See Comments)    intolarence    ROS See HPI    Objective:    Physical Exam Constitutional:      General: She is not in acute distress.    Appearance: Normal appearance. She is well-developed.  HENT:     Head: Normocephalic and atraumatic.     Right Ear: External ear normal.     Left Ear: External ear normal.  Eyes:     General: No scleral icterus. Neck:     Thyroid : No thyromegaly.  Cardiovascular:     Rate and Rhythm: Normal rate and regular rhythm.     Heart sounds: Normal heart sounds. No murmur heard. Pulmonary:     Effort: Pulmonary effort is normal. No respiratory distress.     Breath sounds: Normal breath sounds. No wheezing.  Abdominal:     Tenderness: There is no right CVA tenderness or left CVA tenderness.  Musculoskeletal:     Cervical back: Neck supple.  Skin:    General: Skin is warm and dry.   Neurological:     Mental Status: She is alert and oriented to person, place, and time.  Psychiatric:        Mood and Affect: Mood normal.        Behavior: Behavior normal.        Thought Content: Thought content normal.        Judgment: Judgment normal.      BP (!) 162/78 (BP Location: Left Wrist, Patient Position: Sitting, Cuff Size: Small)   Pulse 77   Temp 98.5 F (36.9 C) (Oral)   Resp 16   Ht 5' (1.524 m)   Wt 124 lb (56.2 kg)   SpO2 99%   BMI 24.22 kg/m  Wt Readings from Last 3 Encounters:  11/28/23 124 lb (56.2 kg)  11/26/23 121 lb 4.1 oz (55 kg)  11/19/23 122 lb (55.3 kg)       Assessment & Plan:   Problem List Items Addressed This Visit       Unprioritized   Thoracic ascending aortic aneurysm   Stable on CT, plan repeat imaging in 1 year.       Relevant Medications   valsartan (DIOVAN) 160 MG tablet   Renal artery stenosis - Primary   Hx of atrophic left kidney. Could be contributing to her HTN.  Obtain US  for further evaluation.  She does continue to follow with nephrology.       Relevant Medications   valsartan (DIOVAN) 160 MG tablet   Other Relevant Orders   VAS US  RENAL ARTERY DUPLEX   Essential hypertension   Uncontrolled. Will d/c micardis  and switch to diovan 160mg . Repeat bp and bmet in 1 week at nurse visit. Hopefully improving her blood pressure will help with headache. OK to continue tylenol  prn pain.      Relevant Medications   valsartan (DIOVAN) 160 MG tablet   Other Relevant Orders   Basic Metabolic Panel (BMET)   Back pain   ? Musculoskeletal. Monitor.       Relevant Orders   Urinalysis,  Routine w reflex microscopic   Urine Culture   Other Visit Diagnoses       Breast cancer screening by mammogram       Relevant Orders   MM 3D SCREENING MAMMOGRAM BILATERAL BREAST     Needs flu shot       Relevant Orders   Flu vaccine HIGH DOSE PF(Fluzone Trivalent) (Completed)       I have discontinued Evee Calamari's telmisartan . I  am also having her start on valsartan. Additionally, I am having her maintain her Leqvio .  Meds ordered this encounter  Medications   valsartan (DIOVAN) 160 MG tablet    Sig: Take 1 tablet (160 mg total) by mouth daily.    Dispense:  90 tablet    Refill:  0    Supervising Provider:   DOMENICA BLACKBIRD A [[id number]]

## 2023-11-29 LAB — URINALYSIS, ROUTINE W REFLEX MICROSCOPIC
Bilirubin Urine: NEGATIVE
Ketones, ur: NEGATIVE
Leukocytes,Ua: NEGATIVE
Nitrite: NEGATIVE
Specific Gravity, Urine: <=1.005 — AB (ref 1.000–1.030)
Total Protein, Urine: NEGATIVE
Urine Glucose: NEGATIVE
Urobilinogen, UA: 0.2 (ref 0.0–1.0)
WBC, UA: NONE SEEN (ref 0–?)
pH: 6 (ref 5.0–8.0)

## 2023-11-29 LAB — URINE CULTURE
MICRO NUMBER:: 17042254
Result:: NO GROWTH
SPECIMEN QUALITY:: ADEQUATE

## 2023-11-30 ENCOUNTER — Ambulatory Visit: Payer: Self-pay | Admitting: Family

## 2023-12-03 ENCOUNTER — Ambulatory Visit (HOSPITAL_BASED_OUTPATIENT_CLINIC_OR_DEPARTMENT_OTHER)
Admission: RE | Admit: 2023-12-03 | Discharge: 2023-12-03 | Disposition: A | Discharge status: Home / Self Care | Source: Ambulatory Visit | Attending: Family | Admitting: Family

## 2023-12-03 ENCOUNTER — Encounter (HOSPITAL_BASED_OUTPATIENT_CLINIC_OR_DEPARTMENT_OTHER): Payer: Self-pay

## 2023-12-03 DIAGNOSIS — Z1231 Encounter for screening mammogram for malignant neoplasm of breast: Secondary | ICD-10-CM | POA: Diagnosis not present

## 2023-12-06 ENCOUNTER — Ambulatory Visit

## 2023-12-06 ENCOUNTER — Other Ambulatory Visit

## 2023-12-06 DIAGNOSIS — I10 Essential (primary) hypertension: Secondary | ICD-10-CM | POA: Diagnosis not present

## 2023-12-06 DIAGNOSIS — Z1211 Encounter for screening for malignant neoplasm of colon: Secondary | ICD-10-CM | POA: Diagnosis not present

## 2023-12-06 LAB — BASIC METABOLIC PANEL WITH GFR
BUN: 16 mg/dL (ref 6–23)
CO2: 29 meq/L (ref 19–32)
Calcium: 9.5 mg/dL (ref 8.4–10.5)
Chloride: 103 meq/L (ref 96–112)
Creatinine, Ser: 0.9 mg/dL (ref 0.40–1.20)
GFR: 63.96 mL/min (ref >60.00)
Glucose, Bld: 93 mg/dL (ref 70–99)
Potassium: 4.4 meq/L (ref 3.5–5.1)
Sodium: 139 meq/L (ref 135–145)

## 2023-12-06 NOTE — Progress Notes (Signed)
 Patient here today for BP check per last ov 11/28/23  Esential Hypertension    Uncontrolled. Will d/c micardis  and switch to diovan 160mg . Repeat bp and bmet in 1 week at nurse visit.     Last BP 162/78 with a pulse of 77 BP today is 135 /82 with a pulse of 70 Patient advised ok to continue same medication and follow up in February as scheduled.  Patient also had labs today, we will contact her with those results once available.

## 2023-12-07 ENCOUNTER — Ambulatory Visit: Admitting: Family

## 2023-12-13 LAB — COLOGUARD: COLOGUARD: NEGATIVE

## 2023-12-17 ENCOUNTER — Ambulatory Visit (HOSPITAL_COMMUNITY)
Admission: RE | Admit: 2023-12-17 | Discharge: 2023-12-17 | Disposition: A | Discharge status: Home / Self Care | Source: Ambulatory Visit | Attending: Family | Admitting: Family

## 2023-12-17 DIAGNOSIS — I701 Atherosclerosis of renal artery: Secondary | ICD-10-CM | POA: Diagnosis not present

## 2023-12-18 ENCOUNTER — Ambulatory Visit: Admitting: Family

## 2023-12-18 ENCOUNTER — Encounter: Payer: Self-pay | Admitting: Family

## 2023-12-18 VITALS — BP 137/64 | HR 81 | Temp 97.2°F | Resp 16 | Ht 59.5 in | Wt 122.8 lb

## 2023-12-18 DIAGNOSIS — I701 Atherosclerosis of renal artery: Secondary | ICD-10-CM | POA: Diagnosis not present

## 2023-12-18 DIAGNOSIS — E78011 Heterozygous familial hypercholesterolemia (hefh): Secondary | ICD-10-CM

## 2023-12-18 DIAGNOSIS — H6993 Unspecified Eustachian tube disorder, bilateral: Secondary | ICD-10-CM | POA: Insufficient documentation

## 2023-12-18 DIAGNOSIS — I10 Essential (primary) hypertension: Secondary | ICD-10-CM

## 2023-12-18 MED ORDER — VALSARTAN-HYDROCHLOROTHIAZIDE 160-12.5 MG PO TABS: 1.0000 | ORAL_TABLET | Freq: Every day | ORAL | 1 refills | Status: DC

## 2023-12-18 MED ORDER — FLUTICASONE PROPIONATE 50 MCG/ACT NA SUSP: 2.0000 | Freq: Every day | NASAL | Status: AC

## 2023-12-18 MED ORDER — LORATADINE 10 MG PO TABS: 10.0000 mg | ORAL_TABLET | Freq: Every day | ORAL | Status: AC | PRN

## 2023-12-18 NOTE — Assessment & Plan Note (Addendum)
 Maintained on Leqvio .  Lab Results  Component Value Date   CHOL 201 (H) 10/23/2023   HDL 59.40 10/23/2023   LDLCALC 121 (H) 10/23/2023   TRIG 103.0 10/23/2023   CHOLHDL 3 10/23/2023   Last LDL above goal of LDL<70. Update lipid panel at blood draw.

## 2023-12-18 NOTE — Assessment & Plan Note (Signed)
  Blood pressure variable, with home readings up to 150 mmHg. Previous valsartan-HCT discontinued due to recall. Current clinic readings at goal. Pt is uncomfortable with the higher readings SBP 150's that she sees at home sometimes in the evenings.  Medication adjusted to Diovan HCT with low-dose hydrochlorothiazide to manage blood pressure without compromising kidney function.  - Recheck potassium and kidney function in two weeks at nurse visit.

## 2023-12-18 NOTE — Assessment & Plan Note (Signed)
  Clear fluid behind the left ear drum causing auditory disturbances. No infection signs. Suspect eustachian tube dysfunction. - Recommend Claritin and Flonase  for two weeks to help drain inner ear fluid.  - If no improvement, consider referral to an ENT specialist.  - Advise against using Claritin D due to potential blood pressure elevation.

## 2023-12-18 NOTE — Patient Instructions (Signed)
 VISIT SUMMARY:  During your visit, we discussed your concerns about ear noise and blood pressure variability. We reviewed your current medications and made some adjustments to better manage your conditions.  YOUR PLAN:  RENAL ARTERY STENOSIS, LEFT KIDNEY: Narrowing of the artery supplying your left kidney, with severity under evaluation. -Consult with a vascular specialist to evaluate the severity and discuss potential interventions like stenting or ultrasound radiofrequency ablation.  HYPERTENSION: Your blood pressure has been fluctuating, with home readings up to 150 mmHg. -Prescribe Diovan HCT with low-dose hydrochlorothiazide to manage your blood pressure. -Recheck your potassium and kidney function in two weeks. -Schedule a nurse visit for a blood pressure check in two weeks.  Eustachian Tube dysfunction, LEFT EAR WITH AUDITORY DISTURBANCE: Clear fluid behind your left ear drum is causing auditory disturbances. -Take Claritin and Flonase  for two weeks to help drain the inner ear fluid. -Avoid using Claritin D as it may elevate your blood pressure. -If there is no improvement, we may refer you to an ENT specialist.

## 2023-12-18 NOTE — Assessment & Plan Note (Signed)
 60% stenosis on the left.  I will refer to vascular specialist for further evaluation.

## 2023-12-18 NOTE — Progress Notes (Signed)
 Subjective:     Patient ID: Monica Lara, female    DOB: [birth year], 74 y.o.   MRN: [id number]  Chief Complaint  Patient presents with   Ear Problem    Patient complains of hearing noise inside ear since last er visit.    Dizziness    Feeling dizzy today    Dizziness    Discussed the use of AI scribe software for clinical note transcription with the patient, who gave verbal consent to proceed.  History of Present Illness Monica Lara is a 73 year old female with renal artery stenosis who presents with concerns about ear noise and blood pressure variability. She experiences a persistent noise in her head, described as a 'running refrigerator sound,' which becomes more pronounced when chewing on something hard. This noise began after a recent emergency room visit and disrupts her sleep, especially when her blood pressure rises at night.    Her blood pressure fluctuates, reaching up to 150 mmHg at night. She previously managed her blood pressure effectively with valsartan and hydrochlorothiazide but discontinued valsartan due to a recall. She currently takes Valsartan160 mg once daily. She inquires about the compatibility of Claritin with her current blood pressure medication, expressing concern about potential interactions.   BP Readings from Last 3 Encounters:  12/18/23 137/64  12/06/23 135/82  11/28/23 (!) 162/78        Health Maintenance Due  Topic Date Due   COVID-19 Vaccine (3 - 2025-26 season) 10/29/2023    Past Medical History:  Diagnosis Date   Abnormal Pap smear of cervix    hpv many yrs ago   Allergy 1956   Cataract Dec 27, 2017   Chronic kidney disease    Elevated cholesterol    Hypertension     Past Surgical History:  Procedure Laterality Date   DILATION AND CURETTAGE OF UTERUS  2001/12/27   ESOPHAGOGASTRODUODENOSCOPY (EGD) WITH PROPOFOL  N/A 05/04/2020   Procedure: ESOPHAGOGASTRODUODENOSCOPY (EGD) WITH PROPOFOL ;  Surgeon: Shila Gustav GAILS, MD;  Location: [facility];  Service: Endoscopy;  Laterality: N/A;   GYNECOLOGIC CRYOSURGERY  1981   for abnormal pap smear many yrs ago   HERNIA REPAIR  27-Dec-2020   HIATAL HERNIA REPAIR N/A 05/06/2020   Procedure: LAPAROSCOPIC REPAIR OF HIATAL HERNIA;  Surgeon: Gladis Cough, MD;  Location: [facility];  Service: General;  Laterality: N/A;   LAPAROSCOPIC NISSEN FUNDOPLICATION N/A 05/06/2020   Procedure: POSSIBLE LAPAROSCOPIC NISSEN FUNDOPLICATION;  Surgeon: Gladis Cough, MD;  Location: [facility];  Service: General;  Laterality: N/A;    Family History  Problem Relation Age of Onset   Bowel Disease Mother        perforated bowel- age 78   Hypertension Mother    Diabetes Father    Hypertension Father    Congestive Heart Failure Father    Heart attack Father    Heart disease Father    Hypertension Brother    Pneumonia Paternal Grandmother        died during the 1916/12/27 flu pandemic    Social History   Socioeconomic History   Marital status: Single    Spouse name: Not on file   Number of children: Not on file   Years of education: Not on file   Highest education level: Master's degree (e.g., MA, MS, MEng, MEd, MSW, MBA)  Occupational History   Not on file  Tobacco Use   Smoking status: Never   Smokeless tobacco: Never  Vaping Use   Vaping status: Never Used  Substance and  Sexual Activity   Alcohol use: Never   Drug use: Never   Sexual activity: Not Currently    Partners: Male    Birth control/protection: Post-menopausal, None  Other Topics Concern   Not on file  Social History Narrative   Not married- divorced since 2000   Two grown daughters- one in [place] one in [place]   1 grandson in college and one who was born in 2020   Retired Runner, 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, 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[place], [place]   Enjoys Agricultural consultant work at [employer] ([employer]   Social research officer, government, walking   Has a dog   Social Drivers of Corporate investment banker Strain: Low Risk  (12/17/2023)   Overall Financial Resource Strain  (CARDIA)    Difficulty of Paying Living Expenses: Not hard at all  Food Insecurity: No Food Insecurity (12/17/2023)   Hunger Vital Sign    Worried About Running Out of Food in the Last Year: Never true    Ran Out of Food in the Last Year: Never true  Transportation Needs: No Transportation Needs (12/17/2023)   PRAPARE - Administrator, Civil Service (Medical): No    Lack of Transportation (Non-Medical): No  Physical Activity: Insufficiently Active (12/17/2023)   Exercise Vital Sign    Days of Exercise per Week: 4 days    Minutes of Exercise per Session: 30 min  Stress: No Stress Concern Present (12/17/2023)   [employer] of Occupational Health - Occupational Stress Questionnaire    Feeling of Stress: Only a little  Social Connections: Moderately Integrated (12/17/2023)   Social Connection and Isolation Panel    Frequency of Communication with Friends and Family: More than three times a week    Frequency of Social Gatherings with Friends and Family: More than three times a week    Attends Religious Services: More than 4 times per year    Active Member of [employer] or Organizations: Yes    Attends Engineer, structural: More than 4 times per year    Marital Status: Divorced  Intimate Partner Violence: Not At Risk (11/13/2023)   Humiliation, Afraid, Rape, and Kick questionnaire    Fear of Current or Ex-Partner: No    Emotionally Abused: No    Physically Abused: No    Sexually Abused: No    Outpatient Medications Prior to Visit  Medication Sig Dispense Refill   inclisiran (LEQVIO ) 284 MG/1.5ML SOSY injection      valsartan (DIOVAN) 160 MG tablet Take 1 tablet (160 mg total) by mouth daily. 90 tablet 0   No facility-administered medications prior to visit.    Allergies  Allergen Reactions   3-Methyl-2-Benzothiazolinone Hydrazone     Other reaction(s): Other   Ezetimibe Other (See Comments)    Other reaction(s): Myalgias (intolerance)   Sulfa Antibiotics  Rash   Statins Other (See Comments)    intolarence   Spironolactone  Anxiety, Hives, Itching and Tinitus    Review of Systems  Neurological:  Positive for dizziness.   See HPI    Objective:    Physical Exam Constitutional:      General: She is not in acute distress.    Appearance: Normal appearance. She is well-developed.  HENT:     Head: Normocephalic and atraumatic.     Comments: Bilateral serous effusion without bulging or erythema    Right Ear: External ear normal.     Left Ear: External ear normal.  Eyes:     General:  No scleral icterus. Neck:     Thyroid : No thyromegaly.  Cardiovascular:     Rate and Rhythm: Normal rate and regular rhythm.     Pulses:          Carotid pulses are 2+ on the right side and 2+ on the left side.    Heart sounds: Normal heart sounds. No murmur heard.    Comments: No carotid bruits noted Pulmonary:     Effort: Pulmonary effort is normal. No respiratory distress.     Breath sounds: Normal breath sounds. No wheezing.  Musculoskeletal:     Cervical back: Neck supple.  Skin:    General: Skin is warm and dry.  Neurological:     Mental Status: She is alert and oriented to person, place, and time.  Psychiatric:        Mood and Affect: Mood normal.        Behavior: Behavior normal.        Thought Content: Thought content normal.        Judgment: Judgment normal.      BP 137/64 (BP Location: Right Arm, Patient Position: Sitting, Cuff Size: Small)   Pulse 81   Temp (!) 97.2 F (36.2 C) (Oral)   Resp 16   Ht 4' 11.5 (1.511 m)   Wt 122 lb 12.8 oz (55.7 kg)   SpO2 100%   BMI 24.39 kg/m  Wt Readings from Last 3 Encounters:  12/18/23 122 lb 12.8 oz (55.7 kg)  11/28/23 124 lb (56.2 kg)  11/26/23 121 lb 4.1 oz (55 kg)       Assessment & Plan:   Problem List Items Addressed This Visit       Unprioritized   Renal artery stenosis   60% stenosis on the left.  I will refer to vascular specialist for further evaluation.       Relevant Medications   valsartan-hydrochlorothiazide (DIOVAN-HCT) 160-12.5 MG tablet   Other Relevant Orders   Ambulatory referral to Vascular Surgery   Heterozygous familial hypercholesterolemia   Maintained on Leqvio .  Lab Results  Component Value Date   CHOL 201 (H) 10/23/2023   HDL 59.40 10/23/2023   LDLCALC 121 (H) 10/23/2023   TRIG 103.0 10/23/2023   CHOLHDL 3 10/23/2023   Last LDL above goal of LDL<70. Update lipid panel at blood draw.       Relevant Medications   valsartan-hydrochlorothiazide (DIOVAN-HCT) 160-12.5 MG tablet   Other Relevant Orders   Lipid panel   Eustachian tube dysfunction, bilateral    Clear fluid behind the left ear drum causing auditory disturbances. No infection signs. Suspect eustachian tube dysfunction. - Recommend Claritin and Flonase  for two weeks to help drain inner ear fluid.  - If no improvement, consider referral to an ENT specialist.  - Advise against using Claritin D due to potential blood pressure elevation.       Relevant Medications   loratadine (CLARITIN) 10 MG tablet   fluticasone  (FLONASE ) 50 MCG/ACT nasal spray   Essential hypertension - Primary    Blood pressure variable, with home readings up to 150 mmHg. Previous valsartan-HCT discontinued due to recall. Current clinic readings at goal. Pt is uncomfortable with the higher readings SBP 150's that she sees at home sometimes in the evenings.  Medication adjusted to Diovan HCT with low-dose hydrochlorothiazide to manage blood pressure without compromising kidney function.  - Recheck potassium and kidney function in two weeks at nurse visit.        Relevant Medications  valsartan-hydrochlorothiazide (DIOVAN-HCT) 160-12.5 MG tablet   Other Relevant Orders   Basic Metabolic Panel (BMET)    I have discontinued Monica Lara's valsartan. I am also having her start on valsartan-hydrochlorothiazide, loratadine, and fluticasone . Additionally, I am having her maintain her  Leqvio .  Meds ordered this encounter  Medications   valsartan-hydrochlorothiazide (DIOVAN-HCT) 160-12.5 MG tablet    Sig: Take 1 tablet by mouth daily.    Dispense:  90 tablet    Refill:  1    Supervising Provider:   DOMENICA BLACKBIRD A [[id number]]   loratadine (CLARITIN) 10 MG tablet    Sig: Take 1 tablet (10 mg total) by mouth daily as needed for allergies.    Supervising Provider:   DOMENICA BLACKBIRD A [[id number]]   fluticasone  (FLONASE ) 50 MCG/ACT nasal spray    Sig: Place 2 sprays into both nostrils daily.    Supervising Provider:   DOMENICA BLACKBIRD A [phone]

## 2023-12-19 ENCOUNTER — Ambulatory Visit

## 2023-12-19 VITALS — BP 139/88 | HR 69 | Temp 97.4°F | Resp 18 | Wt 122.8 lb

## 2023-12-19 DIAGNOSIS — E78011 Heterozygous familial hypercholesterolemia (hefh): Secondary | ICD-10-CM | POA: Diagnosis not present

## 2023-12-19 MED ORDER — INCLISIRAN SODIUM 284 MG/1.5ML ~~LOC~~ SOSY
284.0000 mg | PREFILLED_SYRINGE | Freq: Once | SUBCUTANEOUS | Status: AC
  Administered 2023-12-19: 284 mg via SUBCUTANEOUS
  Filled 2023-12-19: qty 1.5

## 2023-12-19 NOTE — Progress Notes (Signed)
 Diagnosis:  Heterozygous familial hypercholesterolemia    Provider:  Mannam, Praveen MD  Procedure: Injection  Leqvio  (inclisiran), Dose: 284 mg, Site: subcutaneous, Number of injections: 1  Injection Site(s): Left arm  Post Care:    Discharge: Condition: Good, Destination: Home . AVS Provided  Performed by:  Adalida Garver, RN

## 2023-12-24 ENCOUNTER — Encounter: Payer: Self-pay | Admitting: Vascular Surgery

## 2023-12-25 ENCOUNTER — Other Ambulatory Visit (HOSPITAL_BASED_OUTPATIENT_CLINIC_OR_DEPARTMENT_OTHER): Payer: Self-pay

## 2023-12-25 ENCOUNTER — Ambulatory Visit: Admitting: Family

## 2023-12-25 ENCOUNTER — Encounter: Payer: Self-pay | Admitting: Cardiovascular Disease

## 2023-12-25 VITALS — BP 154/82 | HR 84 | Resp 16 | Ht 59.5 in | Wt 122.6 lb

## 2023-12-25 DIAGNOSIS — I10 Essential (primary) hypertension: Secondary | ICD-10-CM

## 2023-12-25 DIAGNOSIS — I701 Atherosclerosis of renal artery: Secondary | ICD-10-CM | POA: Diagnosis not present

## 2023-12-25 MED ORDER — METOPROLOL SUCCINATE ER 50 MG PO TB24
50.0000 mg | ORAL_TABLET | Freq: Every day | ORAL | 1 refills | Status: AC
  Filled 2023-12-25: qty 90, 90d supply, fill #0
  Filled 2024-03-10: qty 90, 90d supply, fill #1

## 2023-12-25 NOTE — Assessment & Plan Note (Signed)
 Uncontrolled. Will continue diovan HCT, add toprol  xl.  Follow up in 2 weeks, advised ok to send me any BP readings that she finds concerning.

## 2023-12-25 NOTE — Patient Instructions (Signed)
 VISIT SUMMARY:  During your visit, we discussed your elevated blood pressure readings, particularly at night, and your concerns about a narrowing of your left renal artery. We adjusted your medication regimen to better manage your blood pressure and heart rate.  YOUR PLAN:  HYPERTENSION IN THE SETTING OF RENAL ARTERY STENOSIS: Your blood pressure has been elevated, especially at night. We need to manage this to prevent complications. -We have added metoprolol  to your current medication regimen. -Continue taking Diovan HCT as prescribed. -Monitor your blood pressure and heart rate regularly. -We will have a follow-up appointment in two weeks to check your blood pressure and labs. -Your prescription has been sent to the pharmacy downstairs.  RENAL ARTERY STENOSIS: You have a previously identified narrowing of your left renal artery. -Attend your vascular specialist appointment on November 11.

## 2023-12-25 NOTE — Assessment & Plan Note (Signed)
 60% stenosis on left.  Has vascular consult scheduled on 11/11.

## 2023-12-25 NOTE — Progress Notes (Signed)
 Subjective:     Patient ID: Monica Lara, female    DOB: [birth year], 73 y.o.   MRN: [id number]  Chief Complaint  Patient presents with   Hypertension    Patient reports still having HBP readings at home   Dizziness    Patient reports some dizziness today and unable to eat    Hypertension  Dizziness    Discussed the use of AI scribe software for clinical note transcription with the patient, who gave verbal consent to proceed.  History of Present Illness Monica Lara is a 73 year old female with hypertension who presents with elevated blood pressure readings.  She experiences significantly elevated blood pressure readings, particularly at night, with the highest readings occurring last night. She recently switched from plain Diovan to Diovan HCT two days ago due to increasing blood pressure. Previously, her blood pressure was well-controlled with readings around 120/65 mmHg to 118/67 mmHg. Over the past two weeks, her blood pressure has gradually increased, reaching 154/82 mmHg at the time of the visit.  She is concerned about a blocked renal artery, which has been identified previously, and is scheduled to see a vascular specialist on November 11th. She has previously taken amlodipine, which caused leg pain, and has not taken metoprolol  before. She takes her medication at 4 PM to prevent nighttime spikes but is unsure if this timing is effective.       Health Maintenance Due  Topic Date Due   COVID-19 Vaccine (3 - 2025-26 season) 10/29/2023    Past Medical History:  Diagnosis Date   Abnormal Pap smear of cervix    hpv many yrs ago   Allergy 01-19-1954   Cataract 2018/01/18   Chronic kidney disease    Elevated cholesterol    Hypertension     Past Surgical History:  Procedure Laterality Date   DILATION AND CURETTAGE OF UTERUS  01/18/2002   ESOPHAGOGASTRODUODENOSCOPY (EGD) WITH PROPOFOL  N/A 05/04/2020   Procedure: ESOPHAGOGASTRODUODENOSCOPY (EGD) WITH PROPOFOL ;  Surgeon: Monica Gustav GAILS, MD;  Location: [facility];  Service: Endoscopy;  Laterality: N/A;   GYNECOLOGIC CRYOSURGERY  1981   for abnormal pap smear many yrs ago   HERNIA REPAIR  2021/01/18   HIATAL HERNIA REPAIR N/A 05/06/2020   Procedure: LAPAROSCOPIC REPAIR OF HIATAL HERNIA;  Surgeon: Monica Cough, MD;  Location: [facility];  Service: General;  Laterality: N/A;   LAPAROSCOPIC NISSEN FUNDOPLICATION N/A 05/06/2020   Procedure: POSSIBLE LAPAROSCOPIC NISSEN FUNDOPLICATION;  Surgeon: Monica Cough, MD;  Location: [facility];  Service: General;  Laterality: N/A;    Family History  Problem Relation Age of Onset   Bowel Disease Mother        perforated bowel- age 68   Hypertension Mother    Diabetes Father    Hypertension Father    Congestive Heart Failure Father    Heart attack Father    Heart disease Father    Hypertension Brother    Pneumonia Paternal Grandmother        died during the 18-Jan-1917 flu pandemic    Social History   Socioeconomic History   Marital status: Single    Spouse name: Not on file   Number of children: Not on file   Years of education: Not on file   Highest education level: Master's degree (e.g., MA, MS, MEng, MEd, MSW, MBA)  Occupational History   Not on file  Tobacco Use   Smoking status: Never   Smokeless tobacco: Never  Vaping Use   Vaping status:  Never Used  Substance and Sexual Activity   Alcohol use: Never   Drug use: Never   Sexual activity: Not Currently    Partners: Male    Birth control/protection: Post-menopausal, None  Other Topics Concern   Not on file  Social History Narrative   Not married- divorced since 2000   Two grown daughters- one in [place] one in [place]   1 grandson in college and one who was born in 2020   Retired runner, 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, 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[place]   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[employer] ([employer])   Social Research Officer, Government, walking   Has a dog   Social Drivers of Corporate Investment Banker Strain: Low Risk  (12/17/2023)   Overall  Financial Resource Strain (CARDIA)    Difficulty of Paying Living Expenses: Not hard at all  Food Insecurity: No Food Insecurity (12/17/2023)   Hunger Vital Sign    Worried About Running Out of Food in the Last Year: Never true    Ran Out of Food in the Last Year: Never true  Transportation Needs: No Transportation Needs (12/17/2023)   PRAPARE - Administrator, Civil Service (Medical): No    Lack of Transportation (Non-Medical): No  Physical Activity: Insufficiently Active (12/17/2023)   Exercise Vital Sign    Days of Exercise per Week: 4 days    Minutes of Exercise per Session: 30 min  Stress: No Stress Concern Present (12/17/2023)   [employer] of Occupational Health - Occupational Stress Questionnaire    Feeling of Stress: Only a little  Social Connections: Moderately Integrated (12/17/2023)   Social Connection and Isolation Panel    Frequency of Communication with Friends and Family: More than three times a week    Frequency of Social Gatherings with Friends and Family: More than three times a week    Attends Religious Services: More than 4 times per year    Active Member of [employer] or Organizations: Yes    Attends Engineer, Structural: More than 4 times per year    Marital Status: Divorced  Intimate Partner Violence: Not At Risk (11/13/2023)   Humiliation, Afraid, Rape, and Kick questionnaire    Fear of Current or Ex-Partner: No    Emotionally Abused: No    Physically Abused: No    Sexually Abused: No    Outpatient Medications Prior to Visit  Medication Sig Dispense Refill   fluticasone  (FLONASE ) 50 MCG/ACT nasal spray Place 2 sprays into both nostrils daily.     inclisiran (LEQVIO ) 284 MG/1.5ML SOSY injection      loratadine (CLARITIN) 10 MG tablet Take 1 tablet (10 mg total) by mouth daily as needed for allergies.     valsartan-hydrochlorothiazide (DIOVAN-HCT) 160-12.5 MG tablet Take 1 tablet by mouth daily. 90 tablet 1   No facility-administered  medications prior to visit.    Allergies  Allergen Reactions   3-Methyl-2-Benzothiazolinone Hydrazone     Other reaction(s): Other   Ezetimibe Other (See Comments)    Other reaction(s): Myalgias (intolerance)   Sulfa Antibiotics Rash   Statins Other (See Comments)    intolarence   Spironolactone  Anxiety, Hives, Itching and Tinitus    Review of Systems  Neurological:  Positive for dizziness.       Objective:    Physical Exam Constitutional:      General: She is not in acute distress.    Appearance: Normal appearance. She is well-developed.  HENT:  Head: Normocephalic and atraumatic.     Right Ear: External ear normal.     Left Ear: External ear normal.  Eyes:     General: No scleral icterus. Neck:     Thyroid : No thyromegaly.  Cardiovascular:     Rate and Rhythm: Normal rate and regular rhythm.     Heart sounds: Normal heart sounds. No murmur heard. Pulmonary:     Effort: Pulmonary effort is normal. No respiratory distress.     Breath sounds: Normal breath sounds. No wheezing.  Musculoskeletal:     Cervical back: Neck supple.  Skin:    General: Skin is warm and dry.  Neurological:     Mental Status: She is alert and oriented to person, place, and time.  Psychiatric:        Mood and Affect: Mood normal.        Behavior: Behavior normal.        Thought Content: Thought content normal.        Judgment: Judgment normal.      BP (!) 154/82 (BP Location: Right Arm, Patient Position: Sitting, Cuff Size: Small)   Pulse 84   Resp 16   Ht 4' 11.5 (1.511 m)   Wt 122 lb 9.6 oz (55.6 kg)   SpO2 100%   BMI 24.35 kg/m  Wt Readings from Last 3 Encounters:  12/25/23 122 lb 9.6 oz (55.6 kg)  12/19/23 122 lb 12.8 oz (55.7 kg)  12/18/23 122 lb 12.8 oz (55.7 kg)       Assessment & Plan:   Problem List Items Addressed This Visit       Unprioritized   Renal artery stenosis   60% stenosis on left.  Has vascular consult scheduled on 11/11.        Relevant  Medications   metoprolol  succinate (TOPROL -XL) 50 MG 24 hr tablet   Essential hypertension - Primary   Uncontrolled. Will continue diovan HCT, add toprol  xl.  Follow up in 2 weeks, advised ok to send me any BP readings that she finds concerning.       Relevant Medications   metoprolol  succinate (TOPROL -XL) 50 MG 24 hr tablet    I am having [name] start on metoprolol  succinate. I am also having her maintain her Leqvio , valsartan-hydrochlorothiazide, loratadine, and fluticasone .  Meds ordered this encounter  Medications   metoprolol  succinate (TOPROL -XL) 50 MG 24 hr tablet    Sig: Take 1 tablet (50 mg total) by mouth daily. Take with or immediately following a meal.    Dispense:  90 tablet    Refill:  1    Supervising Provider:   DOMENICA BLACKBIRD A [[id number]]

## 2023-12-27 ENCOUNTER — Telehealth: Payer: Self-pay

## 2023-12-27 NOTE — Telephone Encounter (Signed)
 Auth Submission: APPROVED - renewal Site of care: Site of care: [facility] Payer: [employer] medicare Medication & CPT/J Code(s) submitted: Leqvio  (Inclisiran) J1306 Diagnosis Code:  Route of submission (phone, fax, portal): auto renewal Phone # Fax # Auth type: Buy/Bill PB Units/visits requested: 284mg  x 2 doses Reference number: [id number] Approval from: 02/28/24 to 02/26/25

## 2024-01-03 ENCOUNTER — Ambulatory Visit

## 2024-01-08 ENCOUNTER — Encounter: Payer: Self-pay | Admitting: Vascular Surgery

## 2024-01-08 ENCOUNTER — Encounter: Payer: Self-pay | Admitting: Family

## 2024-01-08 ENCOUNTER — Ambulatory Visit (INDEPENDENT_AMBULATORY_CARE_PROVIDER_SITE_OTHER): Admitting: Family

## 2024-01-08 ENCOUNTER — Ambulatory Visit: Attending: Vascular Surgery | Admitting: Vascular Surgery

## 2024-01-08 VITALS — BP 127/58 | HR 65 | Temp 97.5°F | Resp 14 | Ht 59.0 in | Wt 124.2 lb

## 2024-01-08 VITALS — BP 132/84 | HR 70 | Temp 97.9°F | Ht 59.5 in | Wt 123.0 lb

## 2024-01-08 DIAGNOSIS — I701 Atherosclerosis of renal artery: Secondary | ICD-10-CM

## 2024-01-08 DIAGNOSIS — E785 Hyperlipidemia, unspecified: Secondary | ICD-10-CM

## 2024-01-08 DIAGNOSIS — E78011 Heterozygous familial hypercholesterolemia (hefh): Secondary | ICD-10-CM

## 2024-01-08 DIAGNOSIS — I10 Essential (primary) hypertension: Secondary | ICD-10-CM | POA: Diagnosis not present

## 2024-01-08 DIAGNOSIS — H6993 Unspecified Eustachian tube disorder, bilateral: Secondary | ICD-10-CM

## 2024-01-08 NOTE — Progress Notes (Signed)
 z  Subjective:     Patient ID: Monica Lara, female    DOB: 1950-08-06, 73 y.o.   MRN: [id number]  Chief Complaint  Patient presents with  . Follow-up    Patient is here for a follow up regarding her BP ; Patient would also like for the provider to check her Left Ear (fluid buildup)     HPI  Discussed the use of AI scribe software for clinical note transcription with the patient, who gave verbal consent to proceed.  History of Present Illness       Health Maintenance Due  Topic Date Due  . COVID-19 Vaccine (3 - 2025-26 season) 10/29/2023    Past Medical History:  Diagnosis Date  . Abnormal Pap smear of cervix    hpv many yrs ago  . Allergy 1956  . Cataract 21-Jan-2018  . Chronic kidney disease   . Elevated cholesterol   . Hypertension     Past Surgical History:  Procedure Laterality Date  . DILATION AND CURETTAGE OF UTERUS  01/21/2002  . ESOPHAGOGASTRODUODENOSCOPY (EGD) WITH PROPOFOL  N/A 05/04/2020   Procedure: ESOPHAGOGASTRODUODENOSCOPY (EGD) WITH PROPOFOL ;  Surgeon: Shila Gustav GAILS, MD;  Location: [facility];  Service: Endoscopy;  Laterality: N/A;  . GYNECOLOGIC CRYOSURGERY  1981   for abnormal pap smear many yrs ago  . HERNIA REPAIR  01/21/2021  . HIATAL HERNIA REPAIR N/A 05/06/2020   Procedure: LAPAROSCOPIC REPAIR OF HIATAL HERNIA;  Surgeon: Gladis Cough, MD;  Location: [facility];  Service: General;  Laterality: N/A;  . LAPAROSCOPIC NISSEN FUNDOPLICATION N/A 05/06/2020   Procedure: POSSIBLE LAPAROSCOPIC NISSEN FUNDOPLICATION;  Surgeon: Gladis Cough, MD;  Location: [facility];  Service: General;  Laterality: N/A;    Family History  Problem Relation Age of Onset  . Bowel Disease Mother        perforated bowel- age 8  . Hypertension Mother   . Diabetes Father   . Hypertension Father   . Congestive Heart Failure Father   . Heart attack Father   . Heart disease Father   . Hypertension Brother   . Pneumonia Paternal Grandmother        died during the January 21, 1917 flu pandemic     Social History   Socioeconomic History  . Marital status: Single    Spouse name: Not on file  . Number of children: Not on file  . Years of education: Not on file  . Highest education level: Master's degree (e.g., MA, MS, MEng, MEd, MSW, MBA)  Occupational History  . Not on file  Tobacco Use  . Smoking status: Never  . Smokeless tobacco: Never  Vaping Use  . Vaping status: Never Used  Substance and Sexual Activity  . Alcohol use: Never  . Drug use: Never  . Sexual activity: Not Currently    Partners: Male    Birth control/protection: Post-menopausal, None  Other Topics Concern  . 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 Difficulty of Paying Living Expenses: Not  hard at all  Food Insecurity: No Food Insecurity (12/17/2023)   Hunger Vital Sign   . Worried About Programme Researcher, Broadcasting/film/video in the Last Year: Never true   . Ran Out of Food in the Last Year: Never true  Transportation Needs: No Transportation Needs (12/17/2023)   PRAPARE - Transportation   . Lack of Transportation (Medical): No   . Lack of Transportation (Non-Medical): No  Physical Activity: Insufficiently Active (12/17/2023)   Exercise Vital Sign   . Days of Exercise per Week: 4 days   . Minutes of Exercise per Session: 30 min  Stress: No Stress Concern Present (12/17/2023)   [employer] of Occupational Health - Occupational Stress Questionnaire   . Feeling of Stress: Only a little  Social Connections: Moderately Integrated (12/17/2023)   Social Connection and Isolation Panel   .  Frequency of Communication with Friends and Family: More than three times a week   . Frequency of Social Gatherings with Friends and Family: More than three times a week   . Attends Religious Services: More than 4 times per year   . Active Member of Clubs or Organizations: Yes   . Attends Banker Meetings: More than 4 times per year   . Marital Status: Divorced  Catering Manager Violence: Not At Risk (11/13/2023)   Humiliation, Afraid, Rape, and Kick questionnaire   . Fear of Current or Ex-Partner: No   . Emotionally Abused: No   . Physically Abused: No   . Sexually Abused: No    Outpatient Medications Prior to Visit  Medication Sig Dispense Refill  . fluticasone  (FLONASE ) 50 MCG/ACT nasal spray Place 2 sprays into both nostrils daily.    . inclisiran (LEQVIO ) 284 MG/1.5ML SOSY injection     . loratadine (CLARITIN) 10 MG tablet Take 1 tablet (10 mg total) by mouth daily as needed for allergies.    . metoprolol  succinate (TOPROL -XL) 50 MG 24 hr tablet Take 1 tablet (50 mg total) by mouth daily. Take with or immediately following a meal. 90 tablet 1  . valsartan-hydrochlorothiazide (DIOVAN-HCT) 160-12.5 MG tablet Take 1 tablet by mouth daily. 90 tablet 1   No facility-administered medications prior to visit.    Allergies  Allergen Reactions  . 3-Methyl-2-Benzothiazolinone Hydrazone     Other reaction(s): Other  . Ezetimibe Other (See Comments)    Other reaction(s): Myalgias (intolerance)  . Sulfa Antibiotics Rash  . Statins Other (See Comments)    intolarence  . Spironolactone  Anxiety, Hives, Itching and Tinitus    ROS     Objective:    Physical Exam   BP (!) 127/58 (BP Location: Right Arm, Patient Position: Sitting, Cuff Size: Normal)   Pulse 65   Temp (!) 97.5 F (36.4 C) (Oral)   Resp 14   Ht 4' 11 (1.499 m)   Wt 124 lb 3.2 oz (56.3 kg)   SpO2 100%   BMI 25.09 kg/m  Wt Readings from Last 3 Encounters:  01/08/24 124 lb 3.2 oz (56.3 kg)  01/08/24  123 lb (55.8 kg)  12/25/23 122 lb 9.6 oz (55.6 kg)       Assessment & Plan:   Problem List Items Addressed This Visit   None   I am having Monica Lara maintain her Leqvio , valsartan-hydrochlorothiazide, loratadine, fluticasone , and metoprolol  succinate.  No orders of the defined types were placed in this encounter.

## 2024-01-08 NOTE — Progress Notes (Signed)
 [facility] [place]  ASSESSMENT / PLAN: 73 y.o. 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  She has hypertension which has been controlled on her current drug regimen of 2 drugs.  No renal insufficiency.  I suspect this is primary hypertension, but will refer to Dr. Annabella Scarce for her opinion.  Will defer intervention for now.  Follow-up with me in 1 year with renal artery duplex unless Dr. Scarce thinks it wise to proceed with intervention.  CHIEF COMPLAINT: renal artery stenosis   HISTORY OF PRESENT ILLNESS: Monica Lara is a 73 y.o. female referred to clinic for evaluation of possible renovascular hypertension. Th\e patient reports poorly controlled hypertension prior to initiation of her current drug regimen that is giving her good control. She reports no episodes of breathlessness. She reports no episodes of flash pulmonary edema. She has normal renal function. We reviewed the challenging diagnosis for renovascular hypertension.   Past Medical History:  Diagnosis Date   Abnormal Pap smear of cervix    hpv many yrs ago   Allergy 1956   Cataract 03-Feb-2018   Chronic kidney disease    Elevated cholesterol    Hypertension     Past Surgical History:  Procedure Laterality Date   DILATION AND CURETTAGE OF UTERUS  02-03-02   ESOPHAGOGASTRODUODENOSCOPY (EGD) WITH PROPOFOL  N/A 05/04/2020   Procedure: ESOPHAGOGASTRODUODENOSCOPY (EGD) WITH PROPOFOL ;  Surgeon: Shila Gustav GAILS, MD;  [facility];  Service: Endoscopy;  Laterality: N/A;   GYNECOLOGIC CRYOSURGERY  1981   for abnormal pap smear many yrs ago   HERNIA REPAIR  02-03-21   HIATAL HERNIA REPAIR N/A 05/06/2020   Procedure: LAPAROSCOPIC REPAIR OF HIATAL HERNIA;  Surgeon: Gladis Cough, MD;  [facility];  Service: General;  Laterality: N/A;   LAPAROSCOPIC NISSEN FUNDOPLICATION N/A 05/06/2020   Procedure: POSSIBLE LAPAROSCOPIC NISSEN FUNDOPLICATION;   Surgeon: Gladis Cough, MD;  [facility];  Service: General;  Laterality: N/A;    Family History  Problem Relation Age of Onset   Bowel Disease Mother        perforated bowel- age 35   Hypertension Mother    Diabetes Father    Hypertension Father    Congestive Heart Failure Father    Heart attack Father    Heart disease Father    Hypertension Brother    Pneumonia Paternal Grandmother        died during the February 03, 1917 flu pandemic    Social History   Socioeconomic History   Marital status: Single    Spouse name: Not on file   Number of children: Not on file   Years of education: Not on file   Highest education level: Master's degree (e.g., MA, MS, MEng, MEd, MSW, MBA)  Occupational History   Not on file  Tobacco Use   Smoking status: Never   Smokeless tobacco: Never  Vaping Use   Vaping status: Never Used  Substance and Sexual Activity   Alcohol use: Never   Drug use: Never   Sexual activity: Not Currently    Partners: Male    Birth control/protection: Post-menopausal, None  Other Topics Concern   Not on file  Social History Narrative   Not married- divorced since 04-Feb-1999   Two grown daughters- one in [place] one in [place]   1 grandson in college and one who was born in February 04, 2019   Retired runner, 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, 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[place],  [place]   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[employer] ([employer])   Social Research Officer, Government, walking   Has a dog   Social Drivers of Corporate Investment Banker Strain: Low Risk  (12/17/2023)   Overall Financial Resource Strain (CARDIA)    Difficulty of Paying Living Expenses: Not hard at all  Food Insecurity: No Food Insecurity (12/17/2023)   Hunger Vital Sign    Worried About Running Out of Food in the Last Year: Never true    Ran Out of Food in the Last Year: Never true  Transportation Needs: No Transportation Needs (12/17/2023)   PRAPARE - Administrator, Civil Service (Medical): No    Lack of Transportation (Non-Medical): No   Physical Activity: Insufficiently Active (12/17/2023)   Exercise Vital Sign    Days of Exercise per Week: 4 days    Minutes of Exercise per Session: 30 min  Stress: No Stress Concern Present (12/17/2023)   [employer] of Occupational Health - Occupational Stress Questionnaire    Feeling of Stress: Only a little  Social Connections: Moderately Integrated (12/17/2023)   Social Connection and Isolation Panel    Frequency of Communication with Friends and Family: More than three times a week    Frequency of Social Gatherings with Friends and Family: More than three times a week    Attends Religious Services: More than 4 times per year    Active Member of [employer] or Organizations: Yes    Attends Banker Meetings: More than 4 times per year    Marital Status: Divorced  Intimate Partner Violence: Not At Risk (11/13/2023)   Humiliation, Afraid, Rape, and Kick questionnaire    Fear of Current or Ex-Partner: No    Emotionally Abused: No    Physically Abused: No    Sexually Abused: No    Allergies  Allergen Reactions   3-Methyl-2-Benzothiazolinone Hydrazone     Other reaction(s): Other   Ezetimibe Other (See Comments)    Other reaction(s): Myalgias (intolerance)   Sulfa Antibiotics Rash   Statins Other (See Comments)    intolarence   Spironolactone  Anxiety, Hives, Itching and Tinitus    Current Outpatient Medications  Medication Sig Dispense Refill   fluticasone  (FLONASE ) 50 MCG/ACT nasal spray Place 2 sprays into both nostrils daily.     inclisiran (LEQVIO ) 284 MG/1.5ML SOSY injection      loratadine (CLARITIN) 10 MG tablet Take 1 tablet (10 mg total) by mouth daily as needed for allergies.     metoprolol  succinate (TOPROL -XL) 50 MG 24 hr tablet Take 1 tablet (50 mg total) by mouth daily. Take with or immediately following a meal. 90 tablet 1   valsartan-hydrochlorothiazide (DIOVAN-HCT) 160-12.5 MG tablet Take 1 tablet by mouth daily. 90 tablet 1   No current  facility-administered medications for this visit.    PHYSICAL EXAM Vitals:   01/08/24 1035  BP: 132/84  Pulse: 70  Temp: 97.9 F (36.6 C)  SpO2: 97%  Weight: 123 lb (55.8 kg)  Height: 4' 11.5 (1.511 m)   Well appearing elderly woman in no distress Regular rate and rhythm Unlabored breathing No swelling in legs  PERTINENT LABORATORY AND RADIOLOGIC DATA  Most recent CBC    Latest Ref Rng & Units 11/26/2023    7:52 PM 11/19/2023   11:32 PM 03/01/2023   11:04 AM  CBC  WBC 4.0 - 10.5 K/uL 6.8  6.0  4.3   Hemoglobin 12.0 - 15.0 g/dL 85.5  86.4  86.7   Hematocrit 36.0 -  46.0 % 42.0  39.6  39.3   Platelets 150 - 400 K/uL 256  228  243.0      Most recent CMP    Latest Ref Rng & Units 12/06/2023    9:47 AM 11/26/2023    7:52 PM 11/19/2023   11:32 PM  CMP  Glucose 70 - 99 mg/dL 93  889  884   BUN 6 - 23 mg/dL 16  15  14    Creatinine 0.40 - 1.20 mg/dL 9.09  9.00  9.06   Sodium 135 - 145 mEq/L 139  138  142   Potassium 3.5 - 5.1 mEq/L 4.4  3.7  4.0   Chloride 96 - 112 mEq/L 103  102  106   CO2 19 - 32 mEq/L 29  23  26    Calcium 8.4 - 10.5 mg/dL 9.5  9.7  9.7   Total Protein 6.5 - 8.1 g/dL  8.0  7.3   Total Bilirubin 0.0 - 1.2 mg/dL  1.0  0.8   Alkaline Phos 38 - 126 U/L  88  79   AST 15 - 41 U/L  24  22   ALT 0 - 44 U/L  12  10     Renal function CrCl cannot be calculated (Patient's most recent lab result is older than the maximum 21 days allowed.).  Hgb A1c MFr Bld (%)  Date Value  11/15/2020 5.7    LDL Chol Calc (NIH)  Date Value Ref Range Status  01/04/2023 118 (H) 0 - 99 mg/dL Final   LDL Cholesterol  Date Value Ref Range Status  10/23/2023 121 (H) 0 - 99 mg/dL Final    ABDOMINAL VISCERAL   Patient Name:  Ralph Benavidez  Date of Exam:   12/17/2023  Medical Rec #: [id number]    Accession #:    [id number]  Date of Birth: 04/07/1950    Patient Gender: F  Patient Age:   59 years  Exam Location:  [address]  Procedure:      VAS US  RENAL ARTERY DUPLEX   Referring Phys: [id number] MELISSA O'SULLIVAN    ---------------------------------------------------------------------------  -----   Limitations: Air/bowel gas.  Performing Technologist: Devere Dark RVT     Examination Guidelines: A complete evaluation includes B-mode imaging,  spectral  Doppler, color Doppler, and power Doppler as needed of all accessible  portions  of each vessel. Bilateral testing is considered an integral part of a  complete  examination. Limited examinations for reoccurring indications may be  performed  as noted.     Duplex Findings:   +------------------+--------+--------+-------+  Right Renal ArteryPSV cm/sEDV cm/sComment  +------------------+--------+--------+-------+  Origin             102      24            +------------------+--------+--------+-------+  Proximal            88      22            +------------------+--------+--------+-------+  Mid                101      73            +------------------+--------+--------+-------+  Distal              91      18            +------------------+--------+--------+-------+   +-----------------+--------+--------+-------+  Left Renal ArteryPSV cm/sEDV cm/sComment  +-----------------+--------+--------+-------+  Origin  338      69            +-----------------+--------+--------+-------+  Proximal           53      15            +-----------------+--------+--------+-------+  Mid                42      12            +-----------------+--------+--------+-------+  Distal             64      17            +-----------------+--------+--------+-------+     Technologist observations: Three cysts visualized on the right kidney with  the largest measuring 6.14 x 8.22 cm.   2.09 x 1.92 cyst left kidney.   +------------+--------+--------+----+-----------+--------+--------+----+  Right KidneyPSV cm/sEDV cm/sRI  Left KidneyPSV cm/sEDV cm/sRI     +------------+--------+--------+----+-----------+--------+--------+----+  Upper Pole  50      12      0.76Upper Pole 31      11      0.65  +------------+--------+--------+----+-----------+--------+--------+----+  Mid        57      13      0.        29      9       0.69  +------------+--------+--------+----+-----------+--------+--------+----+  Lower Pole  71      17      0.76Lower Pole 28      7       0.75  +------------+--------+--------+----+-----------+--------+--------+----+  Hilar      100     20      0.80Hilar      38      13      0.66  +------------+--------+--------+----+-----------+--------+--------+----+   +------------------+-------+------------------+-------+  Right Kidney             Left Kidney                +------------------+-------+------------------+-------+  RAR                     RAR                        +------------------+-------+------------------+-------+  RAR (manual)      0.75   RAR (manual)      2.5      +------------------+-------+------------------+-------+  Cortex                  Cortex                     +------------------+-------+------------------+-------+  Cortex thickness  1.80 mmCorex thickness   0.67 mm  +------------------+-------+------------------+-------+  Kidney length (cm)13.69  Kidney length (cm)8.81     +------------------+-------+------------------+-------+     Summary:  Renal:    Right: Normal size right kidney. RRV flow present. No evidence of         right renal artery stenosis. Cyst(s) noted.  Left:  Abnormal size for the left kidney. LRV flow present. Evidence         of a > 60% stenosis in the left renal artery. Abnormal         cortical thickness of the left kidney. Cyst(s) noted.    *See table(s) above for measurements and observations.    Diagnosing physician: Fonda Rim    Electronically signed by Fonda  Robins on 12/17/2023 at 2:43:22 PM.    Debby SAILOR. Magda, MD FACS [facility] Phone Number: [phone] 01/08/2024 4:52 PM   Total time spent on preparing this encounter including chart review, data review, collecting history, examining the patient, and coordinating care: 45 min  Portions of this report may have been transcribed using voice recognition software.  Every effort has been made to ensure accuracy; however, inadvertent computerized transcription errors may still be present.

## 2024-01-09 ENCOUNTER — Ambulatory Visit: Payer: Self-pay | Admitting: Family

## 2024-01-09 LAB — LIPID PANEL
Cholesterol: 190 mg/dL (ref 0–200)
HDL: 49.1 mg/dL (ref >39.00)
LDL Cholesterol: 95 mg/dL (ref 0–99)
NonHDL: 140.58
Total CHOL/HDL Ratio: 4
Triglycerides: 229 mg/dL — ABNORMAL HIGH (ref 0.0–149.0)
VLDL: 45.8 mg/dL — ABNORMAL HIGH (ref 0.0–40.0)

## 2024-01-09 LAB — BASIC METABOLIC PANEL WITH GFR
BUN: 14 mg/dL (ref 6–23)
CO2: 30 meq/L (ref 19–32)
Calcium: 9.3 mg/dL (ref 8.4–10.5)
Chloride: 102 meq/L (ref 96–112)
Creatinine, Ser: 1.03 mg/dL (ref 0.40–1.20)
GFR: 54.36 mL/min — ABNORMAL LOW (ref >60.00)
Glucose, Bld: 70 mg/dL (ref 70–99)
Potassium: 4 meq/L (ref 3.5–5.1)
Sodium: 139 meq/L (ref 135–145)

## 2024-01-09 NOTE — Assessment & Plan Note (Signed)
 Lab Results  Component Value Date   CHOL 201 (H) 10/23/2023   HDL 59.40 10/23/2023   LDLCALC 121 (H) 10/23/2023   TRIG 103.0 10/23/2023   CHOLHDL 3 10/23/2023   On Leqvio .  Update lipid panel.

## 2024-01-09 NOTE — Assessment & Plan Note (Signed)
 BP Readings from Last 3 Encounters:  01/08/24 (!) 127/58  01/08/24 132/84  12/25/23 (!) 154/82   BP much improved on Toprol  XL, continue same.

## 2024-01-09 NOTE — Assessment & Plan Note (Signed)
 Saw [facility]ascular earlier today and provider felt that her HTN was likely primary hypertension and is not recommending any intervention at this time, just referral to HTN clinic which he ordered.

## 2024-01-09 NOTE — Patient Instructions (Signed)
  VISIT SUMMARY: Today, you had a follow-up appointment to discuss your blood pressure management and ear congestion. Your blood pressure has been stable with your current medication, and you have not experienced any side effects. You also mentioned intermittent ear congestion, which improves with Claritin. Additionally, we reviewed your cholesterol levels and discussed monitoring your kidney function and potassium levels due to your diuretic use.  YOUR PLAN: -ESSENTIAL HYPERTENSION: Essential hypertension is high blood pressure with no identifiable cause. Your blood pressure is well-controlled with your current medication regimen, which includes Toprol  XL. You should continue taking your medication as prescribed and follow up with the hypertension clinic.  -EUSTACHIAN TUBE DYSFUNCTION WITH ALLERGIC RHINITIS: Eustachian tube dysfunction with allergic rhinitis is when the tube connecting your middle ear to the back of your nose doesn't function properly, often due to allergies. This can cause ear congestion. Your symptoms improve with Claritin and Flonase , so you should continue taking Claritin once daily as needed and use Flonase  with two sprays in each nostril daily.  -HYPERLIPIDEMIA: Hyperlipidemia is having high levels of fats (lipids) in your blood, which can increase your risk of heart disease. We recently checked your cholesterol levels and will monitor your kidney function and potassium levels due to your diuretic use. Lab tests have been ordered for these purposes, and you can have your cholesterol checked if you wish.  INSTRUCTIONS: Please follow up with the hypertension clinic as advised. Continue your current medications and use Claritin and Flonase  as needed for your ear congestion. Lab tests have been ordered to monitor your kidney function and potassium levels. If you would like to check your cholesterol levels, please let us  know.                      Contains text  generated by [employer].                                 Contains text generated by [employer].

## 2024-01-09 NOTE — Assessment & Plan Note (Signed)
 Recommended addition of claritin and flonase .  No sign of infection.

## 2024-01-17 ENCOUNTER — Encounter: Payer: Self-pay | Admitting: Vascular Surgery

## 2024-01-18 ENCOUNTER — Encounter: Payer: Self-pay | Admitting: Family

## 2024-01-21 ENCOUNTER — Telehealth: Admitting: Physician Assistant

## 2024-01-21 DIAGNOSIS — J019 Acute sinusitis, unspecified: Secondary | ICD-10-CM | POA: Diagnosis not present

## 2024-01-21 DIAGNOSIS — B9689 Other specified bacterial agents as the cause of diseases classified elsewhere: Secondary | ICD-10-CM | POA: Diagnosis not present

## 2024-01-22 MED ORDER — DOXYCYCLINE HYCLATE 100 MG PO TABS: 100.0000 mg | ORAL_TABLET | Freq: Two times a day (BID) | ORAL | 0 refills | Status: AC

## 2024-01-22 NOTE — Progress Notes (Signed)

## 2024-02-17 ENCOUNTER — Encounter (INDEPENDENT_AMBULATORY_CARE_PROVIDER_SITE_OTHER): Payer: Self-pay

## 2024-02-22 ENCOUNTER — Other Ambulatory Visit: Payer: Self-pay

## 2024-02-22 ENCOUNTER — Other Ambulatory Visit: Payer: Self-pay | Admitting: Family

## 2024-02-22 DIAGNOSIS — I10 Essential (primary) hypertension: Secondary | ICD-10-CM

## 2024-02-22 MED ORDER — VALSARTAN-HYDROCHLOROTHIAZIDE 160-12.5 MG PO TABS: 1.0000 | ORAL_TABLET | Freq: Every day | ORAL | 1 refills | Status: AC

## 2024-03-11 ENCOUNTER — Other Ambulatory Visit (HOSPITAL_BASED_OUTPATIENT_CLINIC_OR_DEPARTMENT_OTHER): Payer: Self-pay

## 2024-03-11 ENCOUNTER — Encounter: Payer: Self-pay | Admitting: Cardiovascular Disease

## 2024-04-04 ENCOUNTER — Telehealth: Payer: Self-pay | Admitting: Family

## 2024-04-04 NOTE — Telephone Encounter (Signed)
 Let's plan to bring her back for a 6 month follow up and do her labs then please.

## 2024-04-04 NOTE — Telephone Encounter (Signed)
 Pt states that [facility] needed to see her to check her potassium sometime in February. The appointment she had for February was canceled and she is wondering if she can just come in for labs instead of an office visit. I do not see any orders in the chart. Please advise.

## 2024-04-18 ENCOUNTER — Institutional Professional Consult (permissible substitution) (HOSPITAL_BASED_OUTPATIENT_CLINIC_OR_DEPARTMENT_OTHER): Admitting: Cardiovascular Disease

## 2024-04-25 ENCOUNTER — Ambulatory Visit: Admitting: Family

## 2024-06-18 ENCOUNTER — Ambulatory Visit

## 2024-07-08 ENCOUNTER — Ambulatory Visit: Admitting: Family

## 2024-11-27 ENCOUNTER — Ambulatory Visit
